# Patient Record
Sex: Male | Born: 1980 | Race: White | Hispanic: No | Marital: Single | Smoking: Current every day smoker
Health system: Southern US, Community
[De-identification: ages and names within clinical notes are randomized; demographics above are authoritative.]

## PROBLEM LIST (undated history)

## (undated) DIAGNOSIS — F419 Anxiety disorder, unspecified: Secondary | ICD-10-CM

## (undated) DIAGNOSIS — S4990XA Unspecified injury of shoulder and upper arm, unspecified arm, initial encounter: Secondary | ICD-10-CM

## (undated) DIAGNOSIS — F319 Bipolar disorder, unspecified: Secondary | ICD-10-CM

## (undated) DIAGNOSIS — I10 Essential (primary) hypertension: Secondary | ICD-10-CM

## (undated) HISTORY — PX: CHOLECYSTECTOMY: SHX55

---

## 2012-07-01 ENCOUNTER — Emergency Department (HOSPITAL_COMMUNITY)
Admission: EM | Admit: 2012-07-01 | Discharge: 2012-07-01 | Disposition: A | Discharge status: Home / Self Care | Payer: Medicaid Other | Attending: Emergency Medicine | Admitting: Emergency Medicine

## 2012-07-01 ENCOUNTER — Encounter (HOSPITAL_COMMUNITY): Payer: Self-pay | Admitting: *Deleted

## 2012-07-01 ENCOUNTER — Emergency Department (HOSPITAL_COMMUNITY): Payer: Medicaid Other

## 2012-07-01 DIAGNOSIS — S39012A Strain of muscle, fascia and tendon of lower back, initial encounter: Secondary | ICD-10-CM

## 2012-07-01 DIAGNOSIS — R52 Pain, unspecified: Secondary | ICD-10-CM

## 2012-07-01 DIAGNOSIS — M542 Cervicalgia: Secondary | ICD-10-CM | POA: Insufficient documentation

## 2012-07-01 DIAGNOSIS — Z9089 Acquired absence of other organs: Secondary | ICD-10-CM | POA: Insufficient documentation

## 2012-07-01 DIAGNOSIS — E119 Type 2 diabetes mellitus without complications: Secondary | ICD-10-CM | POA: Insufficient documentation

## 2012-07-01 DIAGNOSIS — I10 Essential (primary) hypertension: Secondary | ICD-10-CM | POA: Insufficient documentation

## 2012-07-01 DIAGNOSIS — S335XXA Sprain of ligaments of lumbar spine, initial encounter: Secondary | ICD-10-CM | POA: Insufficient documentation

## 2012-07-01 DIAGNOSIS — S161XXA Strain of muscle, fascia and tendon at neck level, initial encounter: Secondary | ICD-10-CM

## 2012-07-01 DIAGNOSIS — S139XXA Sprain of joints and ligaments of unspecified parts of neck, initial encounter: Secondary | ICD-10-CM | POA: Insufficient documentation

## 2012-07-01 DIAGNOSIS — W010XXA Fall on same level from slipping, tripping and stumbling without subsequent striking against object, initial encounter: Secondary | ICD-10-CM | POA: Insufficient documentation

## 2012-07-01 DIAGNOSIS — M25519 Pain in unspecified shoulder: Secondary | ICD-10-CM | POA: Insufficient documentation

## 2012-07-01 HISTORY — DX: Essential (primary) hypertension: I10

## 2012-07-01 MED ORDER — DIAZEPAM 5 MG PO TABS
ORAL_TABLET | ORAL | Status: AC
  Filled 2012-07-01: qty 1

## 2012-07-01 MED ORDER — MORPHINE SULFATE 4 MG/ML IJ SOLN
8.0000 mg | Freq: Once | INTRAMUSCULAR | Status: DC
  Filled 2012-07-01: qty 2

## 2012-07-01 MED ORDER — DEXAMETHASONE SODIUM PHOSPHATE 4 MG/ML IJ SOLN
8.0000 mg | Freq: Once | INTRAMUSCULAR | Status: DC
  Filled 2012-07-01: qty 2

## 2012-07-01 MED ORDER — ONDANSETRON HCL 4 MG PO TABS
4.0000 mg | ORAL_TABLET | Freq: Once | ORAL | Status: AC
  Administered 2012-07-01: 4 mg via ORAL
  Filled 2012-07-01: qty 1

## 2012-07-01 MED ORDER — METHOCARBAMOL 500 MG PO TABS: ORAL_TABLET | ORAL | Status: DC

## 2012-07-01 MED ORDER — HYDROCODONE-ACETAMINOPHEN 7.5-500 MG PO TABS: ORAL_TABLET | ORAL | Status: DC

## 2012-07-01 MED ORDER — DIAZEPAM 5 MG PO TABS
10.0000 mg | ORAL_TABLET | Freq: Once | ORAL | Status: AC
  Administered 2012-07-01: 10 mg via ORAL
  Filled 2012-07-01: qty 2

## 2012-07-01 NOTE — ED Notes (Signed)
Alert, NAD, pain entire back.  Ambulatory in to tx room.

## 2012-07-01 NOTE — ED Provider Notes (Signed)
History     CSN: [id number]  Arrival date & time 07/01/12  4098   First MD Initiated Contact with Patient 07/01/12 2103      Chief Complaint  Patient presents with  . Fall    (Consider location/radiation/quality/duration/timing/severity/associated sxs/prior treatment) HPI Comments: Patient states that on yesterday September 23 he was carrying a TV. He stepped on a slick board and fell backwards mostly on his lower back. The patient states that he felt a" jar all way up to his head and neck. He was able to get up but it took quite a while for him to get up from a fallen position. The patient states he's been having increasing pain from his back up to his neck all during the day today, and presents to the emergency department for additional evaluation.  Patient is a 31 y.o. male presenting with fall. The history is provided by the patient.  Fall Pertinent negatives include no abdominal pain and no hematuria.    Past Medical History  Diagnosis Date  . Diabetes mellitus   . Hypertension     Past Surgical History  Procedure Date  . Cholecystectomy     History reviewed. No pertinent family history.  History  Substance Use Topics  . Smoking status: Current Every Day Smoker  . Smokeless tobacco: Not on file  . Alcohol Use: No      Review of Systems  Constitutional: Negative for activity change.       All ROS Neg except as noted in HPI  HENT: Negative for nosebleeds and neck pain.   Eyes: Negative for photophobia and discharge.  Respiratory: Negative for cough, shortness of breath and wheezing.   Cardiovascular: Negative for chest pain and palpitations.  Gastrointestinal: Negative for abdominal pain and blood in stool.  Genitourinary: Negative for dysuria, frequency and hematuria.  Musculoskeletal: Positive for arthralgias. Negative for back pain.  Skin: Negative.   Neurological: Negative for dizziness, seizures and speech difficulty.  Psychiatric/Behavioral: Negative  for hallucinations and confusion.    Allergies  Tramadol; Aspirin; and Motrin  Home Medications   Current Outpatient Rx  Name Route Sig Dispense Refill  . ACETAMINOPHEN 500 MG PO TABS Oral Take 1,000 mg by mouth daily as needed. For pain    . DIAZEPAM 10 MG PO TABS Oral Take 10 mg by mouth 2 (two) times daily.    Marland Kitchen LISINOPRIL-HYDROCHLOROTHIAZIDE 10-12.5 MG PO TABS Oral Take 1 tablet by mouth daily.    Marland Kitchen METFORMIN HCL 500 MG PO TABS Oral Take 500 mg by mouth 2 (two) times daily.    . OXYCODONE-ACETAMINOPHEN 5-325 MG PO TABS Oral Take 1 tablet by mouth every 4 (four) hours as needed.    Marland Kitchen QUETIAPINE FUMARATE 50 MG PO TABS Oral Take 50 mg by mouth at bedtime.      BP 132/92  Pulse 92  Temp 98.6 F (37 C)  Resp 20  SpO2 99%  Physical Exam  Nursing note and vitals reviewed. Constitutional: He is oriented to person, place, and time. He appears well-developed and well-nourished.  Non-toxic appearance.  HENT:  Head: Normocephalic.  Right Ear: Tympanic membrane and external ear normal.  Left Ear: Tympanic membrane and external ear normal.  Eyes: EOM and lids are normal. Pupils are equal, round, and reactive to light.  Neck: Normal range of motion. Neck supple. Carotid bruit is not present.       There is soreness of the neck there is pain of the lower portion of the  cervical area extending into the right and left shoulders. There is no palpable step off.  Cardiovascular: Normal rate, regular rhythm, normal heart sounds, intact distal pulses and normal pulses.   Pulmonary/Chest: Breath sounds normal. No respiratory distress.  Abdominal: Soft. Bowel sounds are normal. There is no tenderness. There is no guarding.  Musculoskeletal: Normal range of motion.       There is pain in the lower neck extending into the right and left shoulders posteriorly. Right greater than left. There is pain with attempted range of motion of the right shoulder. There is no dislocation. This pain is not new as  the patient has been told he will need surgery on the shoulder. The patient has pain of the thoracic spine area to palpation. There is paraspinal tenderness and spasm to palpation. There is pain to palpation of the lumbar area to palpation. There is paraspinal tenderness and spasm to palpation as well. The pain of the thoracic and the lumbar area are aggravated by change of position or movement. There is no pain to movement of the pelvis. There is full range of motion of the lower extremities.  Lymphadenopathy:       Head (right side): No submandibular adenopathy present.       Head (left side): No submandibular adenopathy present.    He has no cervical adenopathy.  Neurological: He is alert and oriented to person, place, and time. He has normal strength. No cranial nerve deficit or sensory deficit.  Skin: Skin is warm and dry.  Psychiatric: His speech is normal. His mood appears anxious.    ED Course  Procedures (including critical care time)  Labs Reviewed - No data to display No results found. Pulse oximetry 99% on room air. Within normal limits by my interpretation.  No diagnosis found.    MDM  I have reviewed nursing notes, vital signs, and all appropriate lab and imaging results for this patient. The CT scan of the cervical spine is negative. The x-ray of the thoracic spine and lumbar spine also read as negative by the radiologist. The patient is ambulatory to the bathroom with minimal problem. No gross neurologic deficits found. It was discussed with the patient that he has multiple areas of muscle spasm present. The expectations of soreness was also discussed with the patient. Patient is to follow with his primary care physician if not improving.       Kathie Dike, [place] 07/01/12 2226

## 2012-07-01 NOTE — ED Notes (Signed)
Miguel Weber when carrying a tv,  Miguel Weber on to low back, Has pain in low back up to head.  No LOC  . Alert.

## 2012-07-02 NOTE — ED Provider Notes (Signed)
Medical screening examination/treatment/procedure(s) were performed by non-physician practitioner and as supervising physician I was immediately available for consultation/collaboration.  Donnetta Hutching, MD 07/02/12 1723

## 2012-09-01 ENCOUNTER — Encounter (HOSPITAL_COMMUNITY): Payer: Self-pay | Admitting: Emergency Medicine

## 2012-09-01 ENCOUNTER — Emergency Department (HOSPITAL_COMMUNITY): Payer: Medicaid Other

## 2012-09-01 ENCOUNTER — Emergency Department (HOSPITAL_COMMUNITY)
Admission: EM | Admit: 2012-09-01 | Discharge: 2012-09-01 | Disposition: A | Discharge status: Home / Self Care | Payer: Medicaid Other | Attending: Emergency Medicine | Admitting: Emergency Medicine

## 2012-09-01 DIAGNOSIS — E119 Type 2 diabetes mellitus without complications: Secondary | ICD-10-CM | POA: Insufficient documentation

## 2012-09-01 DIAGNOSIS — X58XXXA Exposure to other specified factors, initial encounter: Secondary | ICD-10-CM | POA: Insufficient documentation

## 2012-09-01 DIAGNOSIS — S39012A Strain of muscle, fascia and tendon of lower back, initial encounter: Secondary | ICD-10-CM

## 2012-09-01 DIAGNOSIS — S161XXA Strain of muscle, fascia and tendon at neck level, initial encounter: Secondary | ICD-10-CM

## 2012-09-01 DIAGNOSIS — Z79899 Other long term (current) drug therapy: Secondary | ICD-10-CM | POA: Insufficient documentation

## 2012-09-01 DIAGNOSIS — S139XXA Sprain of joints and ligaments of unspecified parts of neck, initial encounter: Secondary | ICD-10-CM | POA: Insufficient documentation

## 2012-09-01 DIAGNOSIS — Y929 Unspecified place or not applicable: Secondary | ICD-10-CM | POA: Insufficient documentation

## 2012-09-01 DIAGNOSIS — Y939 Activity, unspecified: Secondary | ICD-10-CM | POA: Insufficient documentation

## 2012-09-01 DIAGNOSIS — F17200 Nicotine dependence, unspecified, uncomplicated: Secondary | ICD-10-CM | POA: Insufficient documentation

## 2012-09-01 DIAGNOSIS — S335XXA Sprain of ligaments of lumbar spine, initial encounter: Secondary | ICD-10-CM | POA: Insufficient documentation

## 2012-09-01 DIAGNOSIS — I10 Essential (primary) hypertension: Secondary | ICD-10-CM | POA: Insufficient documentation

## 2012-09-01 MED ORDER — OXYCODONE-ACETAMINOPHEN 5-325 MG PO TABS: 1.0000 | ORAL_TABLET | ORAL | Status: AC | PRN

## 2012-09-01 MED ORDER — CYCLOBENZAPRINE HCL 10 MG PO TABS
10.0000 mg | ORAL_TABLET | Freq: Once | ORAL | Status: AC
  Administered 2012-09-01: 10 mg via ORAL
  Filled 2012-09-01: qty 1

## 2012-09-01 MED ORDER — OXYCODONE-ACETAMINOPHEN 5-325 MG PO TABS
2.0000 | ORAL_TABLET | Freq: Once | ORAL | Status: AC
  Administered 2012-09-01: 2 via ORAL
  Filled 2012-09-01: qty 2

## 2012-09-01 MED ORDER — CYCLOBENZAPRINE HCL 10 MG PO TABS: 10.0000 mg | ORAL_TABLET | Freq: Three times a day (TID) | ORAL | Status: DC | PRN

## 2012-09-01 NOTE — ED Notes (Signed)
Low back pain, onset 2-3 days ago.

## 2012-09-01 NOTE — ED Notes (Signed)
Pt c/o back pain from the bottom of his neck to the top of buttocks. Denies trauma/injury. Pt states it feels like his spine is twisting as he walks.

## 2012-09-02 NOTE — ED Provider Notes (Signed)
History     CSN: [id number]  Arrival date & time 09/01/12  1647   First MD Initiated Contact with Patient 09/01/12 1658      Chief Complaint  Patient presents with  . Back Pain    (Consider location/radiation/quality/duration/timing/severity/associated sxs/prior treatment) HPI Comments: Patient c/o pain to his neck and lower back for several days.  States the pain is worse with certain movements and feels like his "spine is twisting" when he stands or walks.  Pain radiates into his left leg at times.  Patient reports hx of intermittent back pain .  He denies recent injury, incontinence, lower extremity weakness or numbness, saddle anesthesia's, dysuria, headaches, fever, chills, vomiting, visual changes or recent illness.   Patient is a 31 y.o. male presenting with back pain and neck injury. The history is provided by the patient.  Back Pain  This is a recurrent problem. The current episode started more than 2 days ago. The problem occurs constantly. The problem has been gradually worsening. The pain is associated with no known injury. The pain is present in the lumbar spine. The quality of the pain is described as aching. The pain radiates to the left thigh. The pain is moderate. The symptoms are aggravated by bending, twisting and certain positions. Associated symptoms include leg pain. Pertinent negatives include no chest pain, no fever, no numbness, no headaches, no abdominal pain, no abdominal swelling, no bowel incontinence, no perianal numbness, no bladder incontinence, no dysuria, no pelvic pain, no paresthesias, no paresis and no weakness. He has tried analgesics for the symptoms. The treatment provided mild relief.  Neck Injury This is a new problem. The current episode started in the past 7 days. The problem occurs constantly. The problem has been unchanged. Associated symptoms include arthralgias and neck pain. Pertinent negatives include no abdominal pain, chest pain, congestion,  diaphoresis, fever, headaches, joint swelling, nausea, numbness, rash, sore throat, swollen glands, urinary symptoms, vertigo, visual change, vomiting or weakness. The symptoms are aggravated by walking, standing and bending. He has tried oral narcotics for the symptoms. The treatment provided mild relief.    Past Medical History  Diagnosis Date  . Diabetes mellitus   . Hypertension     Past Surgical History  Procedure Date  . Cholecystectomy     No family history on file.  History  Substance Use Topics  . Smoking status: Current Every Day Smoker  . Smokeless tobacco: Not on file  . Alcohol Use: No      Review of Systems  Constitutional: Negative for fever, diaphoresis, activity change and appetite change.  HENT: Positive for neck pain. Negative for congestion, sore throat, facial swelling, trouble swallowing and neck stiffness.   Eyes: Negative for photophobia and visual disturbance.  Respiratory: Negative for shortness of breath.   Cardiovascular: Negative for chest pain.  Gastrointestinal: Negative for nausea, vomiting, abdominal pain, constipation and bowel incontinence.  Genitourinary: Negative for bladder incontinence, dysuria, hematuria, flank pain, decreased urine volume, difficulty urinating and pelvic pain.       No perineal numbness or incontinence of urine or feces  Musculoskeletal: Positive for back pain and arthralgias. Negative for joint swelling.  Skin: Negative for rash.  Neurological: Negative for dizziness, vertigo, facial asymmetry, weakness, numbness, headaches and paresthesias.  Psychiatric/Behavioral: Negative for confusion.  All other systems reviewed and are negative.    Allergies  Tramadol; Aspirin; and Motrin  Home Medications   Current Outpatient Rx  Name  Route  Sig  Dispense  Refill  .  ACETAMINOPHEN 500 MG PO TABS   Oral   Take 1,000 mg by mouth daily as needed. For pain         . DIAZEPAM 10 MG PO TABS   Oral   Take 10 mg by  mouth 2 (two) times daily.         Marland Kitchen LISINOPRIL-HYDROCHLOROTHIAZIDE 10-12.5 MG PO TABS   Oral   Take 1 tablet by mouth daily.         Marland Kitchen METFORMIN HCL 500 MG PO TABS   Oral   Take 500 mg by mouth 2 (two) times daily.         . OXYCODONE-ACETAMINOPHEN 10-325 MG PO TABS   Oral   Take 2 tablets by mouth once. *Took two remaining tablets in two separate doses from previous prescription*         . QUETIAPINE FUMARATE 25 MG PO TABS   Oral   Take 25 mg by mouth at bedtime.         . CYCLOBENZAPRINE HCL 10 MG PO TABS   Oral   Take 1 tablet (10 mg total) by mouth 3 (three) times daily as needed for muscle spasms.   21 tablet   0   . OXYCODONE-ACETAMINOPHEN 5-325 MG PO TABS   Oral   Take 1 tablet by mouth every 4 (four) hours as needed for pain.   16 tablet   0     BP 157/83  Pulse 81  Temp 98.2 F (36.8 C)  Resp 22  Ht 6\' 5"  (1.956 m)  Wt 330 lb (149.687 kg)  BMI 39.13 kg/m2  SpO2 96%  Physical Exam  Nursing note and vitals reviewed. Constitutional: He is oriented to person, place, and time. He appears well-developed and well-nourished. No distress.  HENT:  Head: Normocephalic and atraumatic.  Mouth/Throat: Oropharynx is clear and moist.  Eyes: EOM are normal. Pupils are equal, round, and reactive to light.  Neck: Normal range of motion and phonation normal. Neck supple. Muscular tenderness present. No spinous process tenderness present. No rigidity. No edema, no erythema and normal range of motion present. No Brudzinski's sign and no Kernig's sign noted. No thyromegaly present.         ttp of the cervical paraspinal muscles.  Grip strength is strong and equal bilaterally.  Sensation intact,  CR < 2 sec  Cardiovascular: Normal rate, regular rhythm, normal heart sounds and intact distal pulses.   No murmur heard. Pulmonary/Chest: Effort normal and breath sounds normal. No respiratory distress. He exhibits no tenderness.  Musculoskeletal: He exhibits tenderness.  He exhibits no edema.       Cervical back: He exhibits tenderness. He exhibits normal range of motion, no bony tenderness, no swelling, no deformity, no spasm and normal pulse.       Lumbar back: He exhibits tenderness, pain and spasm. He exhibits normal range of motion, no bony tenderness, no swelling, no deformity, no laceration and normal pulse.       Back:       ttp of the lumbar paraspinal muscles and left SI joint.  DP pulses are equal and brisk bilaterally, sensation intact.    Lymphadenopathy:    He has no cervical adenopathy.  Neurological: He is alert and oriented to person, place, and time. No sensory deficit. He exhibits normal muscle tone. Coordination and gait normal.  Reflex Scores:      Tricep reflexes are 2+ on the right side and 2+ on the left side.  Bicep reflexes are 2+ on the right side and 2+ on the left side.      Patellar reflexes are 2+ on the right side and 2+ on the left side.      Achilles reflexes are 2+ on the right side and 2+ on the left side. Skin: Skin is warm and dry.    ED Course  Procedures (including critical care time)  Labs Reviewed - No data to display Dg Cervical Spine Complete  09/01/2012  *RADIOLOGY REPORT*  Clinical Data: Left neck pain.  No known injury.  CERVICAL SPINE - COMPLETE 4+ VIEW  Comparison: CT dated 07/01/2012.  Findings: Normal appearing bones and soft tissues.  IMPRESSION: Normal examination.   Original Report Authenticated By: Beckie Salts, M.D.    Dg Lumbar Spine Complete  09/01/2012  *RADIOLOGY REPORT*  Clinical Data: Low back pain.  No known injury.  LUMBAR SPINE - COMPLETE 4+ VIEW  Comparison: 07/01/2012.  Findings: Five non-rib bearing lumbar vertebrae.  No significant change in mild levoconvex scoliosis and cholecystectomy clips.  No fractures, pars defects or subluxations.  IMPRESSION: No acute abnormality.   Original Report Authenticated By: Beckie Salts, M.D.      1. Lumbar strain   2. Cervical strain        MDM    Patient has ttp of the cervical and lumbar paraspinal muscles.  No focal neuro deficits on exam. No meningeal signs.  Pt is well appearing.   Ambulates with a steady gait.   Moves all extremities w/o difficulty.  Likely musculoskeletal injury/spasms.  Doubt emergent neurological or infectious process.     Pt agrees to apply ice, f/u with his PMD.  Given referral info for PMD at pt's request.  Also given orthopedic referral   Prescribed: Percocet #[id number] flexeril   Marquon Alcala L. [place], [place] 09/02/12 [id number]

## 2012-09-04 NOTE — ED Provider Notes (Signed)
Medical screening examination/treatment/procedure(s) were performed by non-physician practitioner and as supervising physician I was immediately available for consultation/collaboration.   Hurman Horn, MD 09/04/12 1910

## 2012-09-07 ENCOUNTER — Encounter (HOSPITAL_COMMUNITY): Payer: Self-pay | Admitting: Emergency Medicine

## 2012-09-07 ENCOUNTER — Emergency Department (HOSPITAL_COMMUNITY)
Admission: EM | Admit: 2012-09-07 | Discharge: 2012-09-07 | Disposition: A | Discharge status: Home / Self Care | Payer: Medicaid Other | Attending: Emergency Medicine | Admitting: Emergency Medicine

## 2012-09-07 DIAGNOSIS — F17200 Nicotine dependence, unspecified, uncomplicated: Secondary | ICD-10-CM | POA: Insufficient documentation

## 2012-09-07 DIAGNOSIS — E119 Type 2 diabetes mellitus without complications: Secondary | ICD-10-CM | POA: Insufficient documentation

## 2012-09-07 DIAGNOSIS — Z9889 Other specified postprocedural states: Secondary | ICD-10-CM | POA: Insufficient documentation

## 2012-09-07 DIAGNOSIS — M545 Low back pain, unspecified: Secondary | ICD-10-CM | POA: Insufficient documentation

## 2012-09-07 DIAGNOSIS — M549 Dorsalgia, unspecified: Secondary | ICD-10-CM

## 2012-09-07 DIAGNOSIS — L738 Other specified follicular disorders: Secondary | ICD-10-CM | POA: Insufficient documentation

## 2012-09-07 DIAGNOSIS — L739 Follicular disorder, unspecified: Secondary | ICD-10-CM

## 2012-09-07 DIAGNOSIS — I10 Essential (primary) hypertension: Secondary | ICD-10-CM | POA: Insufficient documentation

## 2012-09-07 DIAGNOSIS — Z79899 Other long term (current) drug therapy: Secondary | ICD-10-CM | POA: Insufficient documentation

## 2012-09-07 MED ORDER — OXYCODONE-ACETAMINOPHEN 5-325 MG PO TABS: 1.0000 | ORAL_TABLET | ORAL | Status: AC | PRN

## 2012-09-07 MED ORDER — SULFAMETHOXAZOLE-TMP DS 800-160 MG PO TABS
1.0000 | ORAL_TABLET | Freq: Once | ORAL | Status: AC
  Administered 2012-09-07: 1 via ORAL
  Filled 2012-09-07: qty 1

## 2012-09-07 MED ORDER — OXYCODONE-ACETAMINOPHEN 5-325 MG PO TABS
2.0000 | ORAL_TABLET | Freq: Once | ORAL | Status: AC
  Administered 2012-09-07: 2 via ORAL
  Filled 2012-09-07: qty 2

## 2012-09-07 MED ORDER — SULFAMETHOXAZOLE-TRIMETHOPRIM 800-160 MG PO TABS: 1.0000 | ORAL_TABLET | Freq: Two times a day (BID) | ORAL | Status: DC

## 2012-09-07 NOTE — ED Notes (Signed)
Pt c/o middle back pain and abscess to the left breast.

## 2012-09-07 NOTE — ED Provider Notes (Signed)
History     CSN: [id number]  Arrival date & time 09/07/12  1303   First MD Initiated Contact with Patient 09/07/12 1335      Chief Complaint  Patient presents with  . Back Pain  . Abscess    (Consider location/radiation/quality/duration/timing/severity/associated sxs/prior treatment) HPI Comments: Patient complains of persistent low back pain for several days to weeks. He states that he was seen here recently and treated for the same. Pain improved but has now returned. States that he is out of his pain medication.patient denies known injury. States pain is worse with certain movements and improves with rest. He has not followed up with his primary care physician.  He denies any incontinence, weakness or numbness to lower extremities, saddle anesthesia's, dysuria, abdominal pain, or fever.  Patient also complains of a painful red" knot" to his chest that he noticed 3 days ago. He states that he thought it was " an infected hair" he complains of worsening pain and drainage to the area. 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  Patient is a 31 y.o. male presenting with back pain. The history is provided by the patient.  Back Pain  This is a chronic problem. The current episode started more than 1 week ago. The problem occurs constantly. The problem has not changed since onset.The pain is associated with no known injury. The pain is present in the lumbar spine and thoracic spine. The quality of the pain is described as aching. The pain does not radiate. The pain is moderate. The symptoms are aggravated by bending, twisting and certain positions. Pertinent negatives include no chest pain, no fever, no numbness, no abdominal pain, no abdominal swelling, no bowel incontinence, no perianal numbness, no bladder incontinence, no dysuria, no pelvic pain, no leg pain, no paresthesias, no paresis, no tingling and no weakness. He has tried analgesics and muscle relaxants for the symptoms. The treatment  provided mild relief.    Past Medical History  Diagnosis Date  . Diabetes mellitus   . Hypertension     Past Surgical History  Procedure Date  . Cholecystectomy     History reviewed. No pertinent family history.  History  Substance Use Topics  . Smoking status: Current Every Day Smoker  . Smokeless tobacco: Not on file  . Alcohol Use: No      Review of Systems  Constitutional: Negative for fever, chills, activity change and appetite change.  Respiratory: Negative for shortness of breath.   Cardiovascular: Negative for chest pain.  Gastrointestinal: Negative for nausea, vomiting, abdominal pain, constipation and bowel incontinence.  Genitourinary: Negative for bladder incontinence, dysuria, hematuria, flank pain, decreased urine volume, difficulty urinating and pelvic pain.       No perineal numbness or incontinence of urine or feces  Musculoskeletal: Positive for back pain. Negative for joint swelling and arthralgias.  Skin: Positive for color change. Negative for rash.       Abscess   Neurological: Negative for tingling, weakness, numbness and paresthesias.  Hematological: Negative for adenopathy.  All other systems reviewed and are negative.    Allergies  Tramadol; Aspirin; and Motrin  Home Medications   Current Outpatient Rx  Name  Route  Sig  Dispense  Refill  . ACETAMINOPHEN 500 MG PO TABS   Oral   Take 1,000 mg by mouth every 6 (six) hours as needed. For pain         . DIAZEPAM 10 MG PO TABS   Oral  Take 10 mg by mouth 2 (two) times daily.         Marland Kitchen LISINOPRIL-HYDROCHLOROTHIAZIDE 10-12.5 MG PO TABS   Oral   Take 1 tablet by mouth daily.         Marland Kitchen METFORMIN HCL 500 MG PO TABS   Oral   Take 500 mg by mouth 2 (two) times daily.         . QUETIAPINE FUMARATE 25 MG PO TABS   Oral   Take 25 mg by mouth at bedtime.         . CYCLOBENZAPRINE HCL 10 MG PO TABS   Oral   Take 1 tablet (10 mg total) by mouth 3 (three) times daily as needed  for muscle spasms.   21 tablet   0   . OXYCODONE-ACETAMINOPHEN 5-325 MG PO TABS   Oral   Take 1 tablet by mouth every 4 (four) hours as needed for pain.   16 tablet   0     BP 152/82  Pulse 84  Temp 98 F (36.7 C) (Oral)  Resp 20  Ht 6\' 4"  (1.93 m)  Wt 320 lb (145.151 kg)  BMI 38.95 kg/m2  SpO2 98%  Physical Exam  Nursing note and vitals reviewed. Constitutional: He is oriented to person, place, and time. He appears well-developed and well-nourished. No distress.  HENT:  Head: Normocephalic and atraumatic.  Neck: Normal range of motion. Neck supple.  Cardiovascular: Normal rate, regular rhythm, normal heart sounds and intact distal pulses.   No murmur heard. Pulmonary/Chest: Effort normal and breath sounds normal. He exhibits no bony tenderness, no crepitus, no edema, no swelling and no retraction.         2 cm slight erythematous papule to the chest wall.  No induration or fluctuance.    Musculoskeletal: He exhibits tenderness. He exhibits no edema.       Lumbar back: He exhibits tenderness and pain. He exhibits normal range of motion, no swelling, no deformity, no laceration and normal pulse.       Back:       ttp of the lumbar and lower thoracic paraspinal muscles.  DP pulses are equal and brisk bilaterally, distal sensation intact.  Back pain is reproduced with SLR bilaterally.    Neurological: He is alert and oriented to person, place, and time. No sensory deficit. He exhibits normal muscle tone. Coordination and gait normal.  Reflex Scores:      Patellar reflexes are 2+ on the right side and 2+ on the left side.      Achilles reflexes are 2+ on the right side and 2+ on the left side. Skin: Skin is warm and dry.    ED Course  Procedures (including critical care time)  Labs Reviewed - No data to display No results found.      MDM    Previous ED chart was reviewed.  Pt was also seen by me on previous visit.  Has appt with his PMD tomorrow  I&D of  abscess not indicated at this time.    Prescribed: Percocet #8 Septra DS      Elmira Olkowski L. [place], [place] 09/09/12 1747

## 2012-09-07 NOTE — ED Notes (Signed)
Pt c/o lower back pain x1 week and presents with abscess to left chest area which he first noticed 3 days ago. Pt states he was seen here previously for back pain and was told to come back if symptoms didn't improve.

## 2012-09-12 NOTE — ED Provider Notes (Signed)
Medical screening examination/treatment/procedure(s) were performed by non-physician practitioner and as supervising physician I was immediately available for consultation/collaboration.   Dione Booze, MD 09/12/12 [phone]

## 2012-09-29 ENCOUNTER — Emergency Department (HOSPITAL_COMMUNITY)
Admission: EM | Admit: 2012-09-29 | Discharge: 2012-09-29 | Disposition: A | Discharge status: Home / Self Care | Payer: Medicaid Other | Attending: Emergency Medicine | Admitting: Emergency Medicine

## 2012-09-29 ENCOUNTER — Encounter (HOSPITAL_COMMUNITY): Payer: Self-pay | Admitting: *Deleted

## 2012-09-29 DIAGNOSIS — Y939 Activity, unspecified: Secondary | ICD-10-CM | POA: Insufficient documentation

## 2012-09-29 DIAGNOSIS — S43499A Other sprain of unspecified shoulder joint, initial encounter: Secondary | ICD-10-CM | POA: Insufficient documentation

## 2012-09-29 DIAGNOSIS — Y929 Unspecified place or not applicable: Secondary | ICD-10-CM | POA: Insufficient documentation

## 2012-09-29 DIAGNOSIS — X58XXXA Exposure to other specified factors, initial encounter: Secondary | ICD-10-CM | POA: Insufficient documentation

## 2012-09-29 DIAGNOSIS — Z791 Long term (current) use of non-steroidal anti-inflammatories (NSAID): Secondary | ICD-10-CM | POA: Insufficient documentation

## 2012-09-29 DIAGNOSIS — F17200 Nicotine dependence, unspecified, uncomplicated: Secondary | ICD-10-CM | POA: Insufficient documentation

## 2012-09-29 DIAGNOSIS — E119 Type 2 diabetes mellitus without complications: Secondary | ICD-10-CM | POA: Insufficient documentation

## 2012-09-29 DIAGNOSIS — I10 Essential (primary) hypertension: Secondary | ICD-10-CM | POA: Insufficient documentation

## 2012-09-29 DIAGNOSIS — S46819A Strain of other muscles, fascia and tendons at shoulder and upper arm level, unspecified arm, initial encounter: Secondary | ICD-10-CM

## 2012-09-29 DIAGNOSIS — Z79899 Other long term (current) drug therapy: Secondary | ICD-10-CM | POA: Insufficient documentation

## 2012-09-29 MED ORDER — PREDNISONE 50 MG PO TABS
60.0000 mg | ORAL_TABLET | Freq: Once | ORAL | Status: AC
  Administered 2012-09-29: 60 mg via ORAL
  Filled 2012-09-29: qty 1

## 2012-09-29 MED ORDER — HYDROCODONE-ACETAMINOPHEN 5-325 MG PO TABS
2.0000 | ORAL_TABLET | Freq: Once | ORAL | Status: AC
  Administered 2012-09-29: 2 via ORAL
  Filled 2012-09-29: qty 2

## 2012-09-29 MED ORDER — DIAZEPAM 5 MG PO TABS
10.0000 mg | ORAL_TABLET | Freq: Once | ORAL | Status: AC
  Administered 2012-09-29: 10 mg via ORAL
  Filled 2012-09-29: qty 2

## 2012-09-29 MED ORDER — METHOCARBAMOL 500 MG PO TABS: ORAL_TABLET | ORAL | Status: DC

## 2012-09-29 MED ORDER — HYDROCODONE-ACETAMINOPHEN 5-325 MG PO TABS: ORAL_TABLET | ORAL | Status: DC

## 2012-09-29 NOTE — ED Notes (Signed)
Pt c/o neck/back pain that began Saturday morning. Pt states "I woke up and felt my neck pop".

## 2012-09-29 NOTE — ED Notes (Signed)
"  My neck popped yesterday and its hurt since then"

## 2012-09-29 NOTE — ED Provider Notes (Signed)
History     CSN: [id number]  Arrival date & time 09/29/12  1444   First MD Initiated Contact with Patient 09/29/12 1520      Chief Complaint  Patient presents with  . Neck Pain    (Consider location/radiation/quality/duration/timing/severity/associated sxs/prior treatment) Patient is a 31 y.o. male presenting with neck pain. The history is provided by the patient.  Neck Pain  This is a chronic problem. The current episode started yesterday. The problem occurs constantly. The problem has not changed since onset.The pain is associated with nothing. There has been no fever. The pain is present in the right side. The quality of the pain is described as aching and cramping. The pain is moderate. The symptoms are aggravated by coughing and sneezing (certain positions.). The pain is the same all the time. Pertinent negatives include no photophobia, no chest pain, no syncope, no bowel incontinence and no bladder incontinence. He has tried analgesics for the symptoms.    Past Medical History  Diagnosis Date  . Diabetes mellitus   . Hypertension     Past Surgical History  Procedure Date  . Cholecystectomy     History reviewed. No pertinent family history.  History  Substance Use Topics  . Smoking status: Current Every Day Smoker  . Smokeless tobacco: Not on file  . Alcohol Use: No      Review of Systems  Constitutional: Negative for activity change.       All ROS Neg except as noted in HPI  HENT: Positive for neck pain. Negative for nosebleeds.   Eyes: Negative for photophobia and discharge.  Respiratory: Negative for cough, shortness of breath and wheezing.   Cardiovascular: Negative for chest pain, palpitations and syncope.  Gastrointestinal: Negative for abdominal pain, blood in stool and bowel incontinence.  Genitourinary: Negative for bladder incontinence, dysuria, frequency and hematuria.  Musculoskeletal: Negative for back pain and arthralgias.  Skin: Negative.    Neurological: Negative for dizziness, seizures and speech difficulty.  Psychiatric/Behavioral: Negative for hallucinations and confusion.    Allergies  Tramadol; Aspirin; and Motrin  Home Medications   Current Outpatient Rx  Name  Route  Sig  Dispense  Refill  . ACETAMINOPHEN 500 MG PO TABS   Oral   Take 1,000 mg by mouth every 6 (six) hours as needed. For pain         . DIAZEPAM 10 MG PO TABS   Oral   Take 10 mg by mouth 2 (two) times daily.         Marland Kitchen LISINOPRIL-HYDROCHLOROTHIAZIDE 10-12.5 MG PO TABS   Oral   Take 1 tablet by mouth daily.         Marland Kitchen METFORMIN HCL 500 MG PO TABS   Oral   Take 500 mg by mouth 2 (two) times daily.           BP 144/79  Pulse 87  Temp 98.1 F (36.7 C) (Oral)  Resp 18  Ht 6\' 3"  (1.905 m)  Wt 320 lb (145.151 kg)  BMI 40.00 kg/m2  SpO2 98%  Physical Exam  Nursing note and vitals reviewed. Constitutional: He is oriented to person, place, and time. He appears well-developed and well-nourished.  Non-toxic appearance.  HENT:  Head: Normocephalic.  Right Ear: Tympanic membrane and external ear normal.  Left Ear: Tympanic membrane and external ear normal.  Eyes: EOM and lids are normal. Pupils are equal, round, and reactive to light.  Neck: Normal range of motion. Neck supple. Carotid bruit is not  present.  Cardiovascular: Normal rate, regular rhythm, normal heart sounds, intact distal pulses and normal pulses.   Pulmonary/Chest: Breath sounds normal. No respiratory distress.  Abdominal: Soft. Bowel sounds are normal. There is no tenderness. There is no guarding.  Musculoskeletal: Normal range of motion.       Right trapezius pain with palpation and attempted ROM.Marland Kitchen  Lymphadenopathy:       Head (right side): No submandibular adenopathy present.       Head (left side): No submandibular adenopathy present.    He has no cervical adenopathy.  Neurological: He is alert and oriented to person, place, and time. He has normal strength. No  cranial nerve deficit or sensory deficit.       Grip symmetrical. No gross deficit.  Skin: Skin is warm and dry.  Psychiatric: He has a normal mood and affect. His speech is normal.    ED Course  Procedures (including critical care time)  Labs Reviewed - No data to display No results found.   No diagnosis found.    MDM  I have reviewed nursing notes, vital signs, and all appropriate lab and imaging results for this patient. Exam suggest muscle strain in the trapezius area. No neuro deficits. Pt treated with Norco and robaxin. Pt to see orthopedic specialist if not improving.       Kathie Dike, [place] 09/30/12 [phone]

## 2012-09-30 NOTE — ED Provider Notes (Signed)
Medical screening examination/treatment/procedure(s) were performed by non-physician practitioner and as supervising physician I was immediately available for consultation/collaboration.    Reylene Stauder R Haydn Cush, MD 09/30/12 2239 

## 2012-10-04 ENCOUNTER — Encounter (HOSPITAL_COMMUNITY): Payer: Self-pay

## 2012-10-04 ENCOUNTER — Emergency Department (HOSPITAL_COMMUNITY)
Admission: EM | Admit: 2012-10-04 | Discharge: 2012-10-04 | Disposition: A | Discharge status: Home / Self Care | Payer: Medicaid Other | Attending: Emergency Medicine | Admitting: Emergency Medicine

## 2012-10-04 DIAGNOSIS — M549 Dorsalgia, unspecified: Secondary | ICD-10-CM | POA: Insufficient documentation

## 2012-10-04 DIAGNOSIS — H53149 Visual discomfort, unspecified: Secondary | ICD-10-CM | POA: Insufficient documentation

## 2012-10-04 DIAGNOSIS — F17200 Nicotine dependence, unspecified, uncomplicated: Secondary | ICD-10-CM | POA: Insufficient documentation

## 2012-10-04 DIAGNOSIS — H00012 Hordeolum externum right lower eyelid: Secondary | ICD-10-CM

## 2012-10-04 DIAGNOSIS — I10 Essential (primary) hypertension: Secondary | ICD-10-CM | POA: Insufficient documentation

## 2012-10-04 DIAGNOSIS — E119 Type 2 diabetes mellitus without complications: Secondary | ICD-10-CM | POA: Insufficient documentation

## 2012-10-04 DIAGNOSIS — H00019 Hordeolum externum unspecified eye, unspecified eyelid: Secondary | ICD-10-CM | POA: Insufficient documentation

## 2012-10-04 MED ORDER — HYDROCODONE-ACETAMINOPHEN 5-325 MG PO TABS: ORAL_TABLET | ORAL | Status: DC

## 2012-10-04 MED ORDER — CYCLOBENZAPRINE HCL 10 MG PO TABS: 10.0000 mg | ORAL_TABLET | Freq: Three times a day (TID) | ORAL | Status: DC | PRN

## 2012-10-04 MED ORDER — TOBRAMYCIN 0.3 % OP SOLN
2.0000 [drp] | Freq: Once | OPHTHALMIC | Status: AC
  Administered 2012-10-04: 2 [drp] via OPHTHALMIC
  Filled 2012-10-04: qty 5

## 2012-10-04 NOTE — ED Notes (Signed)
Pt c/o bilateral shoulder pain since Wednesday.

## 2012-10-04 NOTE — ED Notes (Signed)
Pt reports that daughter jumped on his back 3-4 days ago and cont. To have back and neck pain, also having drainage from right eye since Wednesday.

## 2012-10-04 NOTE — ED Provider Notes (Signed)
History     CSN: [id number]  Arrival date & time 10/04/12  1546   First MD Initiated Contact with Patient 10/04/12 1733      Chief Complaint  Patient presents with  . Back Pain  . Eye Drainage    (Consider location/radiation/quality/duration/timing/severity/associated sxs/prior treatment) HPI Comments: Patient with hx of chronic low back pain c/o increased pain for several days after his young daughter jumped onto his back.  Describes a aching , sharp pain that radiates from his right neck down to his lower back.  States pain is similar to previous pain.  Patient was seen here several days ago and treated for neck pain.  He denies dysuria, incontinence, saddle anesthesia's , abdominal pain or testicular pain.  He also c/o pain, redness, drainage and swelling of the right lower eyelid for several days.  He denies contact use, fever, neck pain or vomiting  Patient is a 31 y.o. male presenting with back pain. The history is provided by the patient.  Back Pain  This is a recurrent problem. The current episode started more than 2 days ago. The problem occurs constantly. The problem has not changed since onset.The pain is associated with twisting (direct blow). The pain is present in the lumbar spine. The quality of the pain is described as aching. The pain does not radiate. The pain is moderate. The symptoms are aggravated by bending, twisting and certain positions. The pain is the same all the time. Pertinent negatives include no fever, no numbness, no headaches, no abdominal pain, no dysuria and no weakness. He has tried analgesics, ice and heat for the symptoms. The treatment provided no relief.    Past Medical History  Diagnosis Date  . Diabetes mellitus   . Hypertension     Past Surgical History  Procedure Date  . Cholecystectomy     No family history on file.  History  Substance Use Topics  . Smoking status: Current Every Day Smoker    Types: Cigarettes  . Smokeless tobacco:  Not on file  . Alcohol Use: No      Review of Systems  Constitutional: Negative for fever.  HENT: Negative for sore throat, facial swelling, trouble swallowing, neck pain and neck stiffness.   Eyes: Positive for photophobia, pain, discharge, redness and visual disturbance.  Respiratory: Negative for shortness of breath.   Gastrointestinal: Negative for vomiting, abdominal pain and constipation.  Genitourinary: Negative for dysuria, hematuria, flank pain, decreased urine volume and difficulty urinating.       No perineal numbness or incontinence of urine or feces  Musculoskeletal: Positive for back pain. Negative for joint swelling.  Skin: Negative for rash.  Neurological: Negative for dizziness, facial asymmetry, weakness, numbness and headaches.  All other systems reviewed and are negative.    Allergies  Tramadol; Aspirin; and Motrin  Home Medications   Current Outpatient Rx  Name  Route  Sig  Dispense  Refill  . ACETAMINOPHEN 500 MG PO TABS   Oral   Take 1,000 mg by mouth every 6 (six) hours as needed. For pain         . DIAZEPAM 10 MG PO TABS   Oral   Take 10 mg by mouth 2 (two) times daily.         Marland Kitchen LISINOPRIL-HYDROCHLOROTHIAZIDE 10-12.5 MG PO TABS   Oral   Take 1 tablet by mouth daily.         Marland Kitchen METFORMIN HCL 500 MG PO TABS   Oral  Take 500 mg by mouth 2 (two) times daily.           BP 138/91  Pulse 80  Temp 97.2 F (36.2 C) (Oral)  Resp 20  Ht 6\' 4"  (1.93 m)  Wt 330 lb (149.687 kg)  BMI 40.17 kg/m2  SpO2 100%  Physical Exam  Nursing note and vitals reviewed. Constitutional: He is oriented to person, place, and time. He appears well-developed and well-nourished. No distress.  HENT:  Head: Normocephalic and atraumatic.  Right Ear: Tympanic membrane and ear canal normal.  Left Ear: Tympanic membrane and ear canal normal.  Mouth/Throat: Uvula is midline, oropharynx is clear and moist and mucous membranes are normal.  Eyes: Conjunctivae  normal and EOM are normal. Pupils are equal, round, and reactive to light. Right eye exhibits exudate and hordeolum. Right eye exhibits no chemosis. No foreign body present in the right eye. Left eye exhibits no discharge, no exudate and no hordeolum. No foreign body present in the left eye. No scleral icterus.    Neck: Normal range of motion. Neck supple.  Cardiovascular: Normal rate, regular rhythm and intact distal pulses.   No murmur heard. Pulmonary/Chest: Effort normal and breath sounds normal.  Musculoskeletal: He exhibits tenderness. He exhibits no edema.       Lumbar back: He exhibits tenderness and pain. He exhibits normal range of motion, no bony tenderness, no swelling, no deformity, no laceration and normal pulse.       Back:       Diffuse ttp of the lumbar and thoracic paraspinal muscles.  Also ttp along the right scapular border. Pt moves all extremities w/o diffuculty  Lymphadenopathy:       Head (right side): No preauricular and no posterior auricular adenopathy present.    He has no cervical adenopathy.  Neurological: He is alert and oriented to person, place, and time. No cranial nerve deficit or sensory deficit. He exhibits normal muscle tone. Coordination and gait normal.  Reflex Scores:      Patellar reflexes are 2+ on the right side and 2+ on the left side.      Achilles reflexes are 2+ on the right side and 2+ on the left side. Skin: Skin is warm and dry.    ED Course  Procedures (including critical care time)  Labs Reviewed - No data to display No results found.      MDM    Previous ED chart reviewed.    Patient has diffuse ttp of the thoracic and lumbar paraspinal muscles.  No focal neuro deficits on exam.  Ambulates with a slow but steady gait.   Pt has multiple ED visits for back and neck pain.  Doubt emergent neuro or infectious process.    Will give referral for pain management.  Pt advised that he will need to f/u with his PMD or pain management     Prescribed: Dispensed tobramycin ophth drops Flexeril  norco #10  Charliene Inoue L. [place], [place] 10/06/12 2109

## 2012-10-06 NOTE — ED Provider Notes (Signed)
Medical screening examination/treatment/procedure(s) were performed by non-physician practitioner and as supervising physician I was immediately available for consultation/collaboration.  Leyton Brownlee, MD 10/06/12 2112 

## 2012-11-23 ENCOUNTER — Emergency Department (HOSPITAL_COMMUNITY): Payer: Medicaid Other

## 2012-11-23 ENCOUNTER — Emergency Department (HOSPITAL_COMMUNITY)
Admission: EM | Admit: 2012-11-23 | Discharge: 2012-11-23 | Disposition: A | Discharge status: Home / Self Care | Payer: Medicaid Other | Attending: Emergency Medicine | Admitting: Emergency Medicine

## 2012-11-23 ENCOUNTER — Encounter (HOSPITAL_COMMUNITY): Payer: Self-pay

## 2012-11-23 DIAGNOSIS — S20229A Contusion of unspecified back wall of thorax, initial encounter: Secondary | ICD-10-CM | POA: Insufficient documentation

## 2012-11-23 DIAGNOSIS — W010XXA Fall on same level from slipping, tripping and stumbling without subsequent striking against object, initial encounter: Secondary | ICD-10-CM | POA: Insufficient documentation

## 2012-11-23 DIAGNOSIS — S335XXA Sprain of ligaments of lumbar spine, initial encounter: Secondary | ICD-10-CM | POA: Insufficient documentation

## 2012-11-23 DIAGNOSIS — F17200 Nicotine dependence, unspecified, uncomplicated: Secondary | ICD-10-CM | POA: Insufficient documentation

## 2012-11-23 DIAGNOSIS — Y9289 Other specified places as the place of occurrence of the external cause: Secondary | ICD-10-CM | POA: Insufficient documentation

## 2012-11-23 DIAGNOSIS — Y9389 Activity, other specified: Secondary | ICD-10-CM | POA: Insufficient documentation

## 2012-11-23 DIAGNOSIS — E119 Type 2 diabetes mellitus without complications: Secondary | ICD-10-CM | POA: Insufficient documentation

## 2012-11-23 DIAGNOSIS — Z79899 Other long term (current) drug therapy: Secondary | ICD-10-CM | POA: Insufficient documentation

## 2012-11-23 DIAGNOSIS — S300XXA Contusion of lower back and pelvis, initial encounter: Secondary | ICD-10-CM

## 2012-11-23 DIAGNOSIS — I10 Essential (primary) hypertension: Secondary | ICD-10-CM | POA: Insufficient documentation

## 2012-11-23 MED ORDER — HYDROCODONE-ACETAMINOPHEN 5-325 MG PO TABS: 1.0000 | ORAL_TABLET | ORAL | Status: DC | PRN

## 2012-11-23 MED ORDER — DIAZEPAM 5 MG PO TABS: 5.0000 mg | ORAL_TABLET | Freq: Two times a day (BID) | ORAL | Status: DC

## 2012-11-23 MED ORDER — OXYCODONE-ACETAMINOPHEN 5-325 MG PO TABS
1.0000 | ORAL_TABLET | Freq: Once | ORAL | Status: AC
  Administered 2012-11-23: 1 via ORAL
  Filled 2012-11-23: qty 1

## 2012-11-23 NOTE — ED Provider Notes (Signed)
Medical screening examination/treatment/procedure(s) were performed by non-physician practitioner and as supervising physician I was immediately available for consultation/collaboration. Rod Majerus, MD, FACEP   Malcolm Hetz L Mccartney Chuba, MD 11/23/12 1623 

## 2012-11-23 NOTE — ED Provider Notes (Signed)
History     CSN: [id number]  Arrival date & time 11/23/12  1320   None     Chief Complaint  Patient presents with  . Fall    HPI Miguel Weber is a 32 y.o. male who presents to the ED with low back pain. The pain started last night after falling on the ice. He was chasing his daughter and fell on a stepping stone. Knocked the breath out. Thought would be ok, took tylenol PM and his last  valium and went to bed. This morning pain is worse. Difficulty getting out of bed.  Denies any other injuries. The history was provided by the patient.  Past Medical History  Diagnosis Date  . Diabetes mellitus   . Hypertension     Past Surgical History  Procedure Laterality Date  . Cholecystectomy      No family history on file.  History  Substance Use Topics  . Smoking status: Current Every Day Smoker    Types: Cigarettes  . Smokeless tobacco: Not on file  . Alcohol Use: No      Review of Systems  Constitutional: Negative for fever and chills.  HENT: Negative for facial swelling, neck pain and neck stiffness.   Eyes: Negative for pain and visual disturbance.  Respiratory: Negative for cough and wheezing.   Cardiovascular: Negative for chest pain and palpitations.  Gastrointestinal: Negative for nausea, vomiting and abdominal pain.  Genitourinary: Negative for dysuria, frequency and difficulty urinating.  Musculoskeletal: Positive for back pain. Gait problem: due to pain.  Skin: Negative for wound.  Neurological: Negative for seizures, syncope and headaches.    Allergies  Tramadol; Aspirin; and Motrin  Home Medications   Current Outpatient Rx  Name  Route  Sig  Dispense  Refill  . acetaminophen (TYLENOL) 500 MG tablet   Oral   Take 1,000 mg by mouth every 6 (six) hours as needed. For pain         . cyclobenzaprine (FLEXERIL) 10 MG tablet   Oral   Take 1 tablet (10 mg total) by mouth 3 (three) times daily as needed for muscle spasms.   21 tablet   0   . diazepam  (VALIUM) 10 MG tablet   Oral   Take 10 mg by mouth 2 (two) times daily.         Marland Kitchen HYDROcodone-acetaminophen (NORCO/VICODIN) 5-325 MG per tablet      Take one-two tabs po q 4-6 hrs prn pain   10 tablet   0   . lisinopril-hydrochlorothiazide (PRINZIDE,ZESTORETIC) 10-12.5 MG per tablet   Oral   Take 1 tablet by mouth daily.         . metFORMIN (GLUCOPHAGE) 500 MG tablet   Oral   Take 500 mg by mouth 2 (two) times daily.           BP 133/88  Pulse 77  Temp(Src) 97.8 F (36.6 C) (Oral)  Resp 20  Ht 6\' 5"  (1.956 m)  Wt 330 lb (149.687 kg)  BMI 39.12 kg/m2  SpO2 98%  Physical Exam  Nursing note and vitals reviewed. Constitutional: He is oriented to person, place, and time. He appears well-developed and well-nourished.  HENT:  Head: Normocephalic and atraumatic.  Eyes: EOM are normal. Pupils are equal, round, and reactive to light.  Neck: Neck supple.  Cardiovascular: Normal rate and regular rhythm.   Pulmonary/Chest: Effort normal and breath sounds normal.  Abdominal: Soft. There is no tenderness.  Musculoskeletal:  Tender lower left lumbar  area, area of ecchymosis noted. Increased pain with range of motion. Pedal pulses strong and equal bilateral. Adequate circulation. Good touch sensation.   Neurological: He is alert and oriented to person, place, and time. He has normal strength and normal reflexes. No cranial nerve deficit or sensory deficit. Gait normal.  Skin: Skin is warm and dry.  Psychiatric: He has a normal mood and affect. His behavior is normal. Judgment and thought content normal.    ED Course  Procedures  Labs Reviewed - No data to display Dg Lumbar Spine Complete  11/23/2012  *RADIOLOGY REPORT*  Clinical Data: Fall  LUMBAR SPINE - COMPLETE 4+ VIEW  Comparison: 09/01/2012  Findings: Anatomic alignment.  Mild narrowing of the L4-5 and L5-S1 discs.  No vertebral compression deformity.  No definite acute fracture.  No pars defect.  IMPRESSION: No acute  bony pathology.   Original Report Authenticated By: Jolaine Click, M.D.    Assessment: 32 y.o. male with lumbar pain due to fall   Contusion and strain of lumbar area  Plan:  Pain management, muscle relaxant   Follow up with ortho Discussed with the patient and all questioned fully answered.   Medication List    TAKE these medications       diazepam 5 MG tablet  Commonly known as:  VALIUM  Take 1 tablet (5 mg total) by mouth 2 (two) times daily.     HYDROcodone-acetaminophen 5-325 MG per tablet  Commonly known as:  NORCO/VICODIN  Take 1 tablet by mouth every 4 (four) hours as needed for pain.      ASK your doctor about these medications       acetaminophen 500 MG tablet  Commonly known as:  TYLENOL  Take 1,000 mg by mouth every 6 (six) hours as needed. For pain     cyclobenzaprine 10 MG tablet  Commonly known as:  FLEXERIL  Take 1 tablet (10 mg total) by mouth 3 (three) times daily as needed for muscle spasms.     diazepam 10 MG tablet  Commonly known as:  VALIUM  Take 10 mg by mouth 2 (two) times daily.     HYDROcodone-acetaminophen 5-325 MG per tablet  Commonly known as:  NORCO/VICODIN  Take one-two tabs po q 4-6 hrs prn pain     lisinopril-hydrochlorothiazide 10-12.5 MG per tablet  Commonly known as:  PRINZIDE,ZESTORETIC  Take 1 tablet by mouth daily.     metFORMIN 500 MG tablet  Commonly known as:  GLUCOPHAGE  Take 500 mg by mouth 2 (two) times daily.         Janne Napoleon, [place] 11/23/12 1515

## 2012-11-23 NOTE — ED Notes (Signed)
Pt slipped on ice last night and landed on bottom but also landed on a steeping stone, one side of stone went into ground on side pt landed on but other side came up and hit back, pt is able to ambulate, denies hitting his head, pain to lower back

## 2012-11-23 NOTE — ED Notes (Signed)
Complain of low back pain from a fall last night

## 2012-12-24 ENCOUNTER — Emergency Department (HOSPITAL_COMMUNITY)
Admission: EM | Admit: 2012-12-24 | Discharge: 2012-12-24 | Disposition: A | Discharge status: Home / Self Care | Payer: Medicaid Other | Attending: Emergency Medicine | Admitting: Emergency Medicine

## 2012-12-24 ENCOUNTER — Encounter (HOSPITAL_COMMUNITY): Payer: Self-pay | Admitting: *Deleted

## 2012-12-24 DIAGNOSIS — E119 Type 2 diabetes mellitus without complications: Secondary | ICD-10-CM | POA: Insufficient documentation

## 2012-12-24 DIAGNOSIS — IMO0002 Reserved for concepts with insufficient information to code with codable children: Secondary | ICD-10-CM | POA: Insufficient documentation

## 2012-12-24 DIAGNOSIS — Z79899 Other long term (current) drug therapy: Secondary | ICD-10-CM | POA: Insufficient documentation

## 2012-12-24 DIAGNOSIS — Z8614 Personal history of Methicillin resistant Staphylococcus aureus infection: Secondary | ICD-10-CM | POA: Insufficient documentation

## 2012-12-24 DIAGNOSIS — F17200 Nicotine dependence, unspecified, uncomplicated: Secondary | ICD-10-CM | POA: Insufficient documentation

## 2012-12-24 DIAGNOSIS — I10 Essential (primary) hypertension: Secondary | ICD-10-CM | POA: Insufficient documentation

## 2012-12-24 LAB — GLUCOSE, CAPILLARY: Glucose-Capillary: 132 mg/dL — ABNORMAL HIGH (ref 70–99)

## 2012-12-24 MED ORDER — OXYCODONE-ACETAMINOPHEN 5-325 MG PO TABS: 1.0000 | ORAL_TABLET | ORAL | Status: DC | PRN

## 2012-12-24 MED ORDER — AMOXICILLIN 500 MG PO CAPS: 1000.0000 mg | ORAL_CAPSULE | Freq: Two times a day (BID) | ORAL | Status: DC

## 2012-12-24 MED ORDER — DOXYCYCLINE HYCLATE 100 MG PO CAPS: 100.0000 mg | ORAL_CAPSULE | Freq: Two times a day (BID) | ORAL | Status: DC

## 2012-12-24 MED ORDER — OXYCODONE-ACETAMINOPHEN 5-325 MG PO TABS
1.0000 | ORAL_TABLET | Freq: Once | ORAL | Status: AC
  Administered 2012-12-24: 1 via ORAL
  Filled 2012-12-24: qty 1

## 2012-12-24 MED ORDER — PENICILLIN V POTASSIUM 250 MG PO TABS
500.0000 mg | ORAL_TABLET | Freq: Once | ORAL | Status: AC
  Administered 2012-12-24: 500 mg via ORAL
  Filled 2012-12-24: qty 2

## 2012-12-24 MED ORDER — DOXYCYCLINE HYCLATE 100 MG PO TABS
100.0000 mg | ORAL_TABLET | Freq: Once | ORAL | Status: AC
  Administered 2012-12-24: 100 mg via ORAL
  Filled 2012-12-24: qty 1

## 2012-12-24 NOTE — ED Notes (Signed)
Abscess under left arm.  

## 2012-12-24 NOTE — ED Notes (Signed)
nad noted prior to dc. Dc instructions reviewed and scripts given to pt. Ambulated out without difficulty.

## 2012-12-24 NOTE — ED Provider Notes (Signed)
History     CSN: [id number]  Arrival date & time 12/24/12  1723   First MD Initiated Contact with Patient 12/24/12 1827      Chief Complaint  Patient presents with  . Abscess    (Consider location/radiation/quality/duration/timing/severity/associated sxs/prior treatment) Patient is a 32 y.o. male presenting with abscess. The history is provided by the patient.  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 Diabetes mellitus   . Hypertension     Past Surgical History  Procedure Laterality Date  . Cholecystectomy      No family history on file.  History  Substance Use Topics  . Smoking status: Current Every Day Smoker -- 1.00 packs/day    Types: Cigarettes  . Smokeless tobacco: Not on file  . Alcohol Use: No      Review of Systems  Constitutional: Negative for fever and activity change.       All ROS Neg except as noted in HPI  HENT: Negative for nosebleeds and neck pain.   Eyes: Negative for photophobia and discharge.  Respiratory: Negative for cough, shortness of breath and wheezing.   Cardiovascular: Negative for chest pain and palpitations.  Gastrointestinal: Negative for abdominal pain and blood in stool.  Genitourinary: Negative for dysuria, frequency and hematuria.  Musculoskeletal: Negative for back pain and arthralgias.  Skin: Negative.        abscess   Neurological: Negative for dizziness, seizures and speech difficulty.  Psychiatric/Behavioral: Negative for hallucinations and confusion.    Allergies  Tramadol; Aspirin; and Motrin  Home Medications   Current Outpatient Rx  Name  Route  Sig  Dispense  Refill  . acetaminophen (TYLENOL) 500 MG tablet   Oral   Take 1,000 mg by mouth every 6 (six) hours as needed. For pain         . amoxicillin (AMOXIL) 500 MG capsule   Oral   Take 2 capsules (1,000 mg total) by mouth 2 (two) times daily.   28 capsule   0   . cyclobenzaprine (FLEXERIL) 10 MG tablet   Oral   Take 1 tablet (10 mg total) by mouth 3 (three) times daily as needed for muscle spasms.   21 tablet   0   . diazepam (VALIUM) 10 MG tablet   Oral   Take 10 mg by mouth 2 (two) times daily.         . diazepam (VALIUM) 5 MG tablet   Oral   Take 1 tablet (5 mg total) by mouth 2 (two) times daily.   10 tablet   0   . doxycycline (VIBRAMYCIN) 100 MG capsule   Oral   Take 1 capsule (100 mg total) by mouth 2 (two) times daily.   20 capsule   0   . HYDROcodone-acetaminophen (NORCO/VICODIN) 5-325 MG per tablet      Take one-two tabs  po q 4-6 hrs prn pain   10 tablet   0   . HYDROcodone-acetaminophen (NORCO/VICODIN) 5-325 MG per tablet   Oral   Take 1 tablet by mouth every 4 (four) hours as needed for pain.   10 tablet   0   . lisinopril-hydrochlorothiazide (PRINZIDE,ZESTORETIC) 10-12.5 MG per tablet   Oral   Take 1 tablet by mouth daily.         . metFORMIN (GLUCOPHAGE) 500 MG tablet   Oral   Take 500 mg by mouth 2 (two) times daily.         Marland Kitchen oxyCODONE-acetaminophen (PERCOCET/ROXICET) 5-325 MG per tablet   Oral   Take 1 tablet by mouth every 4 (four) hours as needed for pain.   24 tablet   0     BP 151/97  Pulse 68  Temp(Src) 97.8 F (36.6 C) (Oral)  Resp 18  Ht 6' 4.5" (1.943 m)  Wt 326 lb (147.873 kg)  BMI 39.17 kg/m2  SpO2 99%  Physical Exam  Nursing note and vitals  reviewed. Constitutional: He is oriented to person, place, and time. He appears well-developed and well-nourished.  Non-toxic appearance.  HENT:  Head: Normocephalic.  Right Ear: Tympanic membrane and external ear normal.  Left Ear: Tympanic membrane and external ear normal.  Eyes: EOM and lids are normal. Pupils are equal, round, and reactive to light.  Neck: Normal range of motion. Neck supple. Carotid bruit is not present.  Cardiovascular: Normal rate, regular rhythm, normal heart sounds, intact distal pulses and normal pulses.   Pulmonary/Chest: Breath sounds normal. No respiratory distress.  Abdominal: Soft. Bowel sounds are normal. There is no tenderness. There is no guarding.  Musculoskeletal: Normal range of motion.  Small abscess of the left axilla. Tender to palpation. No red streaking noted. No drainage present. The area is warm but not hot.  Lymphadenopathy:       Head (right side): No submandibular adenopathy present.       Head (left side): No submandibular adenopathy present.    He has no cervical adenopathy.  Neurological: He is alert and oriented to person, place, and time. He has normal strength. No cranial nerve deficit or sensory deficit.  Skin: Skin is warm and dry.  Psychiatric: He has a normal mood and affect. His speech is normal.    ED Course  Procedures (including critical care time)  Labs Reviewed  GLUCOSE, CAPILLARY - Abnormal; Notable for the following:    Glucose-Capillary 132 (*)    All other components within normal limits   No results found.   1. Abscess of axilla, left       MDM  I have reviewed nursing notes, vital signs, and all appropriate lab and imaging results for this patient. 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 He has had the raised tender area under his left axilla for the past 3-4 days. He denies any high fevers. The area is not draining. He has not been treated for  the problem prior to this emergency department visit.  The abscess is a small and not a candidate for incision and drainage at this time. The patient is asked to do warm tub soaks daily, use Amoxil and doxycycline daily. Patient is given a prescription for Percocet one every 4 hours as needed for pain, #24. Patient see his primary physician or return to the emergency department if any changes, problems, or concerns. Patient is also  advised to watch his glucose closely.       Kathie Dike, PA-C 12/24/12 1857

## 2012-12-25 NOTE — ED Provider Notes (Signed)
Medical screening examination/treatment/procedure(s) were performed by non-physician practitioner and as supervising physician I was immediately available for consultation/collaboration. Sigurd Pugh, MD, FACEP   Ernie Kasler L Peityn Payton, MD 12/25/12 0105 

## 2012-12-30 ENCOUNTER — Encounter (HOSPITAL_COMMUNITY): Payer: Self-pay | Admitting: *Deleted

## 2012-12-30 ENCOUNTER — Emergency Department (HOSPITAL_COMMUNITY)
Admission: EM | Admit: 2012-12-30 | Discharge: 2012-12-30 | Disposition: A | Discharge status: Home / Self Care | Payer: Medicaid Other | Attending: Emergency Medicine | Admitting: Emergency Medicine

## 2012-12-30 ENCOUNTER — Emergency Department (HOSPITAL_COMMUNITY): Payer: Medicaid Other

## 2012-12-30 DIAGNOSIS — S239XXA Sprain of unspecified parts of thorax, initial encounter: Secondary | ICD-10-CM | POA: Insufficient documentation

## 2012-12-30 DIAGNOSIS — R296 Repeated falls: Secondary | ICD-10-CM | POA: Insufficient documentation

## 2012-12-30 DIAGNOSIS — E119 Type 2 diabetes mellitus without complications: Secondary | ICD-10-CM | POA: Insufficient documentation

## 2012-12-30 DIAGNOSIS — F17200 Nicotine dependence, unspecified, uncomplicated: Secondary | ICD-10-CM | POA: Insufficient documentation

## 2012-12-30 DIAGNOSIS — Y9389 Activity, other specified: Secondary | ICD-10-CM | POA: Insufficient documentation

## 2012-12-30 DIAGNOSIS — S139XXA Sprain of joints and ligaments of unspecified parts of neck, initial encounter: Secondary | ICD-10-CM | POA: Insufficient documentation

## 2012-12-30 DIAGNOSIS — S161XXA Strain of muscle, fascia and tendon at neck level, initial encounter: Secondary | ICD-10-CM

## 2012-12-30 DIAGNOSIS — S29019A Strain of muscle and tendon of unspecified wall of thorax, initial encounter: Secondary | ICD-10-CM

## 2012-12-30 DIAGNOSIS — I10 Essential (primary) hypertension: Secondary | ICD-10-CM | POA: Insufficient documentation

## 2012-12-30 DIAGNOSIS — Y92009 Unspecified place in unspecified non-institutional (private) residence as the place of occurrence of the external cause: Secondary | ICD-10-CM | POA: Insufficient documentation

## 2012-12-30 DIAGNOSIS — L02419 Cutaneous abscess of limb, unspecified: Secondary | ICD-10-CM

## 2012-12-30 DIAGNOSIS — Z79899 Other long term (current) drug therapy: Secondary | ICD-10-CM | POA: Insufficient documentation

## 2012-12-30 DIAGNOSIS — IMO0002 Reserved for concepts with insufficient information to code with codable children: Secondary | ICD-10-CM | POA: Insufficient documentation

## 2012-12-30 MED ORDER — OXYCODONE-ACETAMINOPHEN 5-325 MG PO TABS
ORAL_TABLET | ORAL | Status: AC
  Filled 2012-12-30: qty 1

## 2012-12-30 MED ORDER — HYDROCODONE-ACETAMINOPHEN 5-325 MG PO TABS
1.0000 | ORAL_TABLET | Freq: Once | ORAL | Status: AC
  Administered 2012-12-30: 1 via ORAL
  Filled 2012-12-30: qty 1

## 2012-12-30 MED ORDER — OXYCODONE-ACETAMINOPHEN 5-325 MG PO TABS
2.0000 | ORAL_TABLET | Freq: Once | ORAL | Status: AC
  Administered 2012-12-30: 2 via ORAL
  Filled 2012-12-30: qty 1

## 2012-12-30 MED ORDER — CYCLOBENZAPRINE HCL 5 MG PO TABS: 5.0000 mg | ORAL_TABLET | Freq: Three times a day (TID) | ORAL | Status: DC | PRN

## 2012-12-30 MED ORDER — OXYCODONE-ACETAMINOPHEN 5-325 MG PO TABS: 1.0000 | ORAL_TABLET | ORAL | Status: DC | PRN

## 2012-12-30 NOTE — ED Notes (Signed)
Fell in yard yesterday, Now pain in back and neck.    No LOC.  Abscess lt axilla

## 2012-12-30 NOTE — ED Notes (Signed)
nad noted prior to dc. Dc instructions reviewed and 2 scripts given to pt. Pt ambulated out without difficulty.

## 2013-01-02 NOTE — ED Provider Notes (Signed)
History     CSN: [id number]  Arrival date & time 12/30/12  1723   First MD Initiated Contact with Patient 12/30/12 1812      Chief Complaint  Patient presents with  . Neck Pain    (Consider location/radiation/quality/duration/timing/severity/associated sxs/prior treatment) HPI Comments: Miguel Weber is a 32 y.o. Male who fell while working in his yard yesterday, landing on is buttocks.  He reports pain in his neck and his upper back which is constant, sharp and also cramping, worse with movement, certain positions and palpation.  He denies weakness or numbness in his extremities, and no other injury.  He has taken tylenol without relief of pain.  He is also currently taking amoxil and doxycycline for an abscess in his left axilla which is improved, smaller but still tender.  He would like this checked.  He denies fevers, chills. Pain is worse with pressure and does not radiate.    The history is provided by the patient.    Past Medical History  Diagnosis Date  . Diabetes mellitus   . Hypertension     Past Surgical History  Procedure Laterality Date  . Cholecystectomy      No family history on file.  History  Substance Use Topics  . Smoking status: Current Every Day Smoker -- 1.00 packs/day    Types: Cigarettes  . Smokeless tobacco: Not on file  . Alcohol Use: No      Review of Systems  Constitutional: Negative for fever.  HENT: Positive for neck pain. Negative for neck stiffness.   Respiratory: Negative for shortness of breath.   Cardiovascular: Negative for chest pain and leg swelling.  Gastrointestinal: Negative for abdominal pain, constipation and abdominal distention.  Genitourinary: Negative for dysuria, urgency, frequency, flank pain and difficulty urinating.  Musculoskeletal: Positive for back pain. Negative for joint swelling and gait problem.  Skin: Negative for color change and rash.  Neurological: Negative for weakness and numbness.    Allergies   Tramadol; Aspirin; and Motrin  Home Medications   Current Outpatient Rx  Name  Route  Sig  Dispense  Refill  . acetaminophen (TYLENOL) 500 MG tablet   Oral   Take 1,000 mg by mouth every 6 (six) hours as needed. For pain         . amoxicillin (AMOXIL) 500 MG capsule   Oral   Take 2 capsules (1,000 mg total) by mouth 2 (two) times daily.   28 capsule   0   . diazepam (VALIUM) 10 MG tablet   Oral   Take 10 mg by mouth 2 (two) times daily.         Marland Kitchen doxycycline (VIBRAMYCIN) 100 MG capsule   Oral   Take 1 capsule (100 mg total) by mouth 2 (two) times daily.   20 capsule   0   . lisinopril-hydrochlorothiazide (PRINZIDE,ZESTORETIC) 10-12.5 MG per tablet   Oral   Take 1 tablet by mouth daily.         . metFORMIN (GLUCOPHAGE) 500 MG tablet   Oral   Take 500 mg by mouth 2 (two) times daily.         Marland Kitchen oxyCODONE-acetaminophen (PERCOCET/ROXICET) 5-325 MG per tablet   Oral   Take 2 tablets by mouth once as needed for pain.         . cyclobenzaprine (FLEXERIL) 5 MG tablet   Oral   Take 1 tablet (5 mg total) by mouth 3 (three) times daily as needed for muscle spasms.  15 tablet   0   . oxyCODONE-acetaminophen (PERCOCET/ROXICET) 5-325 MG per tablet   Oral   Take 1 tablet by mouth every 4 (four) hours as needed for pain.   20 tablet   0     BP 152/89  Pulse 69  Temp(Src) 98.1 F (36.7 C) (Oral)  Resp 20  Ht 6\' 4"  (1.93 m)  Wt 320 lb (145.151 kg)  BMI 38.97 kg/m2  SpO2 99%  Physical Exam  Nursing note and vitals reviewed. Constitutional: He appears well-developed and well-nourished.  HENT:  Head: Normocephalic.  Eyes: Conjunctivae are normal.  Neck: Normal range of motion. Neck supple.  Cardiovascular: Normal rate and intact distal pulses.   Pedal pulses normal.  Pulmonary/Chest: Effort normal.  Abdominal: Soft. Bowel sounds are normal. He exhibits no distension and no mass.  Musculoskeletal: Normal range of motion. He exhibits no edema.        Cervical back: He exhibits tenderness. He exhibits normal range of motion, no bony tenderness, no edema and no spasm.       Thoracic back: He exhibits bony tenderness and spasm.       Lumbar back: He exhibits no tenderness, no swelling, no edema and no spasm.  Neurological: He is alert. He has normal strength. He displays no atrophy and no tremor. No sensory deficit. Gait normal.  Reflex Scores:      Patellar reflexes are 2+ on the right side and 2+ on the left side.      Achilles reflexes are 2+ on the right side and 2+ on the left side. No strength deficit noted in hip and knee flexor and extensor muscle groups.  Ankle flexion and extension intact.  Skin: Skin is warm and dry.  Small indurated nodule left axilla, no drainage or surrounding erythema, no red streaking.  Psychiatric: He has a normal mood and affect.    ED Course  Procedures (including critical care time)  Labs Reviewed - No data to display No results found.   1. Cervical strain, acute, initial encounter   2. Strain of thoracic spine, initial encounter   3. Axillary abscess       MDM  Patients labs and/or radiological studies were viewed and considered during the medical decision making and disposition process. Normal xrays and nonfocal neuro exam - patient encouraged ice tx,  Followed by heat tx starting tomorrow.  Pt has been using warm compresses to axillary abscess, encouraged to continue and to finish abx.  Prescribed flexeril and oxycodone .  Encouraged f/u with pcp prn.        Burgess Amor, PA-C 01/02/13 0026  Burgess Amor, PA-C 01/02/13 1610

## 2013-01-04 NOTE — ED Provider Notes (Signed)
Medical screening examination/treatment/procedure(s) were performed by non-physician practitioner and as supervising physician I was immediately available for consultation/collaboration. Devoria Albe, MD, FACEP   Ward Givens, MD 01/04/13 [phone]

## 2013-01-09 ENCOUNTER — Emergency Department (HOSPITAL_COMMUNITY)
Admission: EM | Admit: 2013-01-09 | Discharge: 2013-01-09 | Disposition: A | Discharge status: Home / Self Care | Payer: Medicaid Other | Attending: Emergency Medicine | Admitting: Emergency Medicine

## 2013-01-09 ENCOUNTER — Encounter (HOSPITAL_COMMUNITY): Payer: Self-pay | Admitting: Emergency Medicine

## 2013-01-09 DIAGNOSIS — I10 Essential (primary) hypertension: Secondary | ICD-10-CM | POA: Insufficient documentation

## 2013-01-09 DIAGNOSIS — E119 Type 2 diabetes mellitus without complications: Secondary | ICD-10-CM | POA: Insufficient documentation

## 2013-01-09 DIAGNOSIS — F17200 Nicotine dependence, unspecified, uncomplicated: Secondary | ICD-10-CM | POA: Insufficient documentation

## 2013-01-09 DIAGNOSIS — IMO0002 Reserved for concepts with insufficient information to code with codable children: Secondary | ICD-10-CM | POA: Insufficient documentation

## 2013-01-09 DIAGNOSIS — L0291 Cutaneous abscess, unspecified: Secondary | ICD-10-CM

## 2013-01-09 DIAGNOSIS — Z79899 Other long term (current) drug therapy: Secondary | ICD-10-CM | POA: Insufficient documentation

## 2013-01-09 MED ORDER — OXYCODONE-ACETAMINOPHEN 5-325 MG PO TABS
1.0000 | ORAL_TABLET | Freq: Once | ORAL | Status: DC
  Filled 2013-01-09: qty 1

## 2013-01-09 MED ORDER — LIDOCAINE HCL (PF) 2 % IJ SOLN
INTRAMUSCULAR | Status: AC
  Filled 2013-01-09: qty 10

## 2013-01-09 MED ORDER — OXYCODONE-ACETAMINOPHEN 5-325 MG PO TABS
1.0000 | ORAL_TABLET | Freq: Once | ORAL | Status: AC
  Administered 2013-01-09: 1 via ORAL

## 2013-01-09 NOTE — ED Notes (Signed)
Patient c/o abscess under left arm.  Patient states some stuff came out of it and it has been hurting worse.

## 2013-01-09 NOTE — ED Provider Notes (Signed)
History     CSN: [id number]  Arrival date & time 01/09/13  0008   First MD Initiated Contact with Patient 01/09/13 0215      Chief Complaint  Patient presents with  . Abscess    (Consider location/radiation/quality/duration/timing/severity/associated sxs/prior treatment) HPI Miguel Weber is a 32 y.o. male who presents to the Emergency Department complaining of pain and an abscess under his left arm that drained a little purulent material earlier in the evening. Denies feve, chills.   Past Medical History  Diagnosis Date  . Diabetes mellitus   . Hypertension     Past Surgical History  Procedure Laterality Date  . Cholecystectomy      No family history on file.  History  Substance Use Topics  . Smoking status: Current Every Day Smoker -- 1.00 packs/day    Types: Cigarettes  . Smokeless tobacco: Not on file  . Alcohol Use: No      Review of Systems  Constitutional: Negative for fever.       10 Systems reviewed and are negative for acute change except as noted in the HPI.  HENT: Negative for congestion.   Eyes: Negative for discharge and redness.  Respiratory: Negative for cough and shortness of breath.   Cardiovascular: Negative for chest pain.  Gastrointestinal: Negative for vomiting and abdominal pain.  Musculoskeletal: Negative for back pain.  Skin: Negative for rash.       Abscess under left arm  Neurological: Negative for syncope, numbness and headaches.  Psychiatric/Behavioral:       No behavior change.    Allergies  Tramadol; Aspirin; and Motrin  Home Medications   Current Outpatient Rx  Name  Route  Sig  Dispense  Refill  . acetaminophen (TYLENOL) 500 MG tablet   Oral   Take 1,000 mg by mouth every 6 (six) hours as needed. For pain         . cyclobenzaprine (FLEXERIL) 5 MG tablet   Oral   Take 1 tablet (5 mg total) by mouth 3 (three) times daily as needed for muscle spasms.   15 tablet   0   . diazepam (VALIUM) 10 MG tablet   Oral  Take 10 mg by mouth 2 (two) times daily.         Marland Kitchen lisinopril-hydrochlorothiazide (PRINZIDE,ZESTORETIC) 10-12.5 MG per tablet   Oral   Take 1 tablet by mouth daily.         . metFORMIN (GLUCOPHAGE) 500 MG tablet   Oral   Take 500 mg by mouth 2 (two) times daily.         Marland Kitchen amoxicillin (AMOXIL) 500 MG capsule   Oral   Take 2 capsules (1,000 mg total) by mouth 2 (two) times daily.   28 capsule   0   . doxycycline (VIBRAMYCIN) 100 MG capsule   Oral   Take 1 capsule (100 mg total) by mouth 2 (two) times daily.   20 capsule   0   . oxyCODONE-acetaminophen (PERCOCET/ROXICET) 5-325 MG per tablet   Oral   Take 2 tablets by mouth once as needed for pain.         Marland Kitchen oxyCODONE-acetaminophen (PERCOCET/ROXICET) 5-325 MG per tablet   Oral   Take 1 tablet by mouth every 4 (four) hours as needed for pain.   20 tablet   0     BP 140/79  Pulse 109  Temp(Src) 98.1 F (36.7 C) (Oral)  Resp 20  Ht 6\' 4"  (1.93 m)  Wt 321  lb (145.605 kg)  BMI 39.09 kg/m2  SpO2 96%  Physical Exam  Nursing note and vitals reviewed. Constitutional: He appears well-developed and well-nourished.  Awake, alert, nontoxic appearance.  HENT:  Head: Normocephalic and atraumatic.  Eyes: EOM are normal. Pupils are equal, round, and reactive to light.  Neck: Neck supple.  Cardiovascular: Normal rate and intact distal pulses.   Pulmonary/Chest: Effort normal and breath sounds normal. He exhibits no tenderness.  Abdominal: Soft. Bowel sounds are normal. There is no tenderness. There is no rebound.  Musculoskeletal: He exhibits no tenderness.  Baseline ROM, no obvious new focal weakness.  Neurological:  Mental status and motor strength appears baseline for patient and situation.  Skin: No rash noted.  Abscess 2 cm x 1 cm under left arm  Psychiatric: He has a normal mood and affect.    ED Course  Procedures (including critical care time) INCISION AND DRAINAGE Performed by: Annamarie Dawley Consent:  Verbal consent obtained. Risks and benefits: risks, benefits and alternatives were discussed Type: abscess Body area: Left axilla Anesthesia: local infiltration Incision was made with a scalpel. Local anesthetic: lidocaine 1%  W/o epinephrine Anesthetic total: 1.5 ml  Complexity: complex Blunt dissection to break up loculations Drainage: purulent Drainage amount: moderate Dressing applied. Patient tolerance: Patient tolerated the procedure well with no immediate complications.      MDM  Patient with abscess in left axilla. I&D completed. Moderate amount of purulent material. Given percocet while here. Pt stable in ED with no significant deterioration in condition.The patient appears reasonably screened and/or stabilized for discharge and I doubt any other medical condition or other [facility] requiring further screening, evaluation, or treatment in the ED at this time prior to discharge.  MDM Reviewed: nursing note and vitals           Nicoletta Dress. Colon Branch, MD 01/09/13 [phone]

## 2013-01-16 ENCOUNTER — Emergency Department (HOSPITAL_COMMUNITY)
Admission: EM | Admit: 2013-01-16 | Discharge: 2013-01-16 | Disposition: A | Discharge status: Home / Self Care | Payer: Medicaid Other | Attending: Emergency Medicine | Admitting: Emergency Medicine

## 2013-01-16 ENCOUNTER — Emergency Department (HOSPITAL_COMMUNITY): Payer: Medicaid Other

## 2013-01-16 ENCOUNTER — Encounter (HOSPITAL_COMMUNITY): Payer: Self-pay | Admitting: *Deleted

## 2013-01-16 DIAGNOSIS — F17200 Nicotine dependence, unspecified, uncomplicated: Secondary | ICD-10-CM | POA: Insufficient documentation

## 2013-01-16 DIAGNOSIS — Y9389 Activity, other specified: Secondary | ICD-10-CM | POA: Insufficient documentation

## 2013-01-16 DIAGNOSIS — S0993XA Unspecified injury of face, initial encounter: Secondary | ICD-10-CM | POA: Insufficient documentation

## 2013-01-16 DIAGNOSIS — M62838 Other muscle spasm: Secondary | ICD-10-CM | POA: Insufficient documentation

## 2013-01-16 DIAGNOSIS — M25512 Pain in left shoulder: Secondary | ICD-10-CM

## 2013-01-16 DIAGNOSIS — S46909A Unspecified injury of unspecified muscle, fascia and tendon at shoulder and upper arm level, unspecified arm, initial encounter: Secondary | ICD-10-CM | POA: Insufficient documentation

## 2013-01-16 DIAGNOSIS — Z79899 Other long term (current) drug therapy: Secondary | ICD-10-CM | POA: Insufficient documentation

## 2013-01-16 DIAGNOSIS — X500XXA Overexertion from strenuous movement or load, initial encounter: Secondary | ICD-10-CM | POA: Insufficient documentation

## 2013-01-16 DIAGNOSIS — S4980XA Other specified injuries of shoulder and upper arm, unspecified arm, initial encounter: Secondary | ICD-10-CM | POA: Insufficient documentation

## 2013-01-16 DIAGNOSIS — Y9289 Other specified places as the place of occurrence of the external cause: Secondary | ICD-10-CM | POA: Insufficient documentation

## 2013-01-16 DIAGNOSIS — I10 Essential (primary) hypertension: Secondary | ICD-10-CM | POA: Insufficient documentation

## 2013-01-16 DIAGNOSIS — W1809XA Striking against other object with subsequent fall, initial encounter: Secondary | ICD-10-CM | POA: Insufficient documentation

## 2013-01-16 DIAGNOSIS — E119 Type 2 diabetes mellitus without complications: Secondary | ICD-10-CM | POA: Insufficient documentation

## 2013-01-16 MED ORDER — OXYCODONE-ACETAMINOPHEN 5-325 MG PO TABS: 1.0000 | ORAL_TABLET | ORAL | Status: DC | PRN

## 2013-01-16 MED ORDER — METHOCARBAMOL 500 MG PO TABS
1000.0000 mg | ORAL_TABLET | Freq: Once | ORAL | Status: AC
  Administered 2013-01-16: 1000 mg via ORAL
  Filled 2013-01-16: qty 2

## 2013-01-16 MED ORDER — DIAZEPAM 5 MG PO TABS: ORAL_TABLET | ORAL | Status: DC

## 2013-01-16 MED ORDER — OXYCODONE-ACETAMINOPHEN 5-325 MG PO TABS
1.0000 | ORAL_TABLET | Freq: Once | ORAL | Status: AC
  Administered 2013-01-16: 1 via ORAL
  Filled 2013-01-16: qty 1

## 2013-01-16 MED ORDER — OXYCODONE-ACETAMINOPHEN 7.5-325 MG PO TABS: 1.0000 | ORAL_TABLET | ORAL | Status: DC | PRN

## 2013-01-16 MED ORDER — METHOCARBAMOL 500 MG PO TABS: 1000.0000 mg | ORAL_TABLET | Freq: Three times a day (TID) | ORAL | Status: DC

## 2013-01-16 NOTE — ED Notes (Signed)
C/o left shoulder pain x 2 days after injury - fell landing on left shoulder and heard a pop.

## 2013-01-19 NOTE — ED Provider Notes (Signed)
History     CSN: [id number]  Arrival date & time 01/16/13  1747   First MD Initiated Contact with Patient 01/16/13 1917      Chief Complaint  Patient presents with  . Shoulder Pain    (Consider location/radiation/quality/duration/timing/severity/associated sxs/prior treatment) HPI Comments: Miguel Weber is a 32 y.o. Male presenting with left shoulder pain and muscle spasm after falling and landing on the shoulder while trying to catch a tv that was toppling off the back end of a pickup truck 2 days ago.  He felt and heard a popping sensation when he landed on the ground. Pain is worsened with palpation and range of motion.  He denies numbness or tingling in his left arm and denies pain in his chest or collarbone although he feels the muscles in his shoulder are spasming.  He has taken his normal valium 10 mg bid for his chronic anxiety disorder which is not relieving spasm. He also took an oxycodone tablet left over from a prescription obtained last month which was not helpful either.    The history is provided by the patient.    Past Medical History  Diagnosis Date  . Diabetes mellitus   . Hypertension     Past Surgical History  Procedure Laterality Date  . Cholecystectomy      No family history on file.  History  Substance Use Topics  . Smoking status: Current Every Day Smoker -- 1.00 packs/day    Types: Cigarettes  . Smokeless tobacco: Not on file  . Alcohol Use: No      Review of Systems  Constitutional: Negative for fever.  HENT: Positive for neck pain.   Respiratory: Negative for shortness of breath.   Cardiovascular: Negative for chest pain.  Musculoskeletal: Positive for arthralgias. Negative for myalgias and joint swelling.  Neurological: Negative for weakness and numbness.    Allergies  Tramadol; Aspirin; and Motrin  Home Medications   Current Outpatient Rx  Name  Route  Sig  Dispense  Refill  . acetaminophen (TYLENOL) 500 MG tablet   Oral  Take 1,000 mg by mouth every 6 (six) hours as needed. For pain         . diazepam (VALIUM) 10 MG tablet   Oral   Take 10 mg by mouth 2 (two) times daily.         Marland Kitchen lisinopril-hydrochlorothiazide (PRINZIDE,ZESTORETIC) 10-12.5 MG per tablet   Oral   Take 1 tablet by mouth daily.         . metFORMIN (GLUCOPHAGE) 500 MG tablet   Oral   Take 500 mg by mouth 2 (two) times daily.         Marland Kitchen oxyCODONE-acetaminophen (PERCOCET/ROXICET) 5-325 MG per tablet   Oral   Take 1 tablet by mouth every 4 (four) hours as needed for pain.   20 tablet   0   . diazepam (VALIUM) 5 MG tablet      Take one additional valium daily,  Every 8 hours for the next 5 days.   5 tablet   0   . oxyCODONE-acetaminophen (PERCOCET) 7.5-325 MG per tablet   Oral   Take 1 tablet by mouth every 4 (four) hours as needed for pain.   15 tablet   0     BP 140/80  Pulse 84  Temp(Src) 98.3 F (36.8 C) (Oral)  Resp 20  Ht 6\' 4"  (1.93 m)  Wt 325 lb (147.419 kg)  BMI 39.58 kg/m2  SpO2 97%  Physical Exam  Constitutional: He appears well-developed and well-nourished.  HENT:  Head: Atraumatic.  Neck: Normal range of motion. Muscular tenderness present. No spinous process tenderness present.    Significant left trapezius muscle spasm.  Cardiovascular:  Pulses:      Radial pulses are 2+ on the right side, and 2+ on the left side.  Pulses equal bilaterally  Musculoskeletal: He exhibits tenderness.       Left shoulder: He exhibits decreased range of motion, bony tenderness and spasm. He exhibits no swelling, no effusion, no crepitus, no deformity, normal pulse and normal strength.  TTP lateral and posterior humeral head.  No clavicle pain or deformity.    Neurological: He is alert. He has normal strength. He displays normal reflexes. No sensory deficit.  Reflex Scores:      Bicep reflexes are 2+ on the right side and 2+ on the left side. Equal grip strength  Skin: Skin is warm and dry.  Psychiatric: He  has a normal mood and affect.    ED Course  Procedures (including critical care time)  Labs Reviewed - No data to display No results found.   1. Shoulder pain, acute, left   2. Muscle spasm of left shoulder area       MDM  Patients labs and/or radiological studies were viewed and considered during the medical decision making and disposition process.  Pt prescribed valium 5 mg ,  Pt already takes bid,  Encouraged to take tid for the next 5 days in addition to heat therapy for muscle spasm. He was provided an arm sling.  Also increased oxycodone to 7.5 mg for the next 2 days.  Advised to f/u with ortho prn,  Referral given.    The patient appears reasonably screened and/or stabilized for discharge and I doubt any other medical condition or other [facility] requiring further screening, evaluation, or treatment in the ED at this time prior to discharge.         Burgess Amor, PA-C 01/19/13 1228

## 2013-01-20 NOTE — ED Provider Notes (Signed)
Medical screening examination/treatment/procedure(s) were performed by non-physician practitioner and as supervising physician I was immediately available for consultation/collaboration.   Chevis Weisensel M Floreen Teegarden, DO 01/20/13 0023 

## 2013-01-27 ENCOUNTER — Emergency Department (HOSPITAL_COMMUNITY)
Admission: EM | Admit: 2013-01-27 | Discharge: 2013-01-27 | Disposition: A | Discharge status: Home / Self Care | Payer: Medicaid Other | Attending: Emergency Medicine | Admitting: Emergency Medicine

## 2013-01-27 ENCOUNTER — Encounter (HOSPITAL_COMMUNITY): Payer: Self-pay | Admitting: *Deleted

## 2013-01-27 DIAGNOSIS — M25512 Pain in left shoulder: Secondary | ICD-10-CM

## 2013-01-27 DIAGNOSIS — M25519 Pain in unspecified shoulder: Secondary | ICD-10-CM | POA: Insufficient documentation

## 2013-01-27 DIAGNOSIS — G8911 Acute pain due to trauma: Secondary | ICD-10-CM | POA: Insufficient documentation

## 2013-01-27 DIAGNOSIS — F17200 Nicotine dependence, unspecified, uncomplicated: Secondary | ICD-10-CM | POA: Insufficient documentation

## 2013-01-27 DIAGNOSIS — E119 Type 2 diabetes mellitus without complications: Secondary | ICD-10-CM | POA: Insufficient documentation

## 2013-01-27 DIAGNOSIS — Z79899 Other long term (current) drug therapy: Secondary | ICD-10-CM | POA: Insufficient documentation

## 2013-01-27 DIAGNOSIS — I10 Essential (primary) hypertension: Secondary | ICD-10-CM | POA: Insufficient documentation

## 2013-01-27 LAB — GLUCOSE, CAPILLARY: Glucose-Capillary: 212 mg/dL — ABNORMAL HIGH (ref 70–99)

## 2013-01-27 MED ORDER — OXYCODONE-ACETAMINOPHEN 5-325 MG PO TABS
1.0000 | ORAL_TABLET | Freq: Once | ORAL | Status: AC
  Administered 2013-01-27: 1 via ORAL
  Filled 2013-01-27: qty 1

## 2013-01-27 MED ORDER — ONDANSETRON 8 MG PO TBDP: 8.0000 mg | ORAL_TABLET | Freq: Two times a day (BID) | ORAL | Status: DC | PRN

## 2013-01-27 MED ORDER — HYDROCODONE-ACETAMINOPHEN 5-325 MG PO TABS: 1.0000 | ORAL_TABLET | ORAL | Status: DC | PRN

## 2013-01-27 NOTE — ED Provider Notes (Signed)
History     CSN: [id number]  Arrival date & time 01/27/13  1549   First MD Initiated Contact with Patient 01/27/13 1614      Chief Complaint  Patient presents with  . Shoulder Pain    (Consider location/radiation/quality/duration/timing/severity/associated sxs/prior treatment) HPI  Miguel Weber is a 32 y.o. male who presents to the ED with shoulder pain. The pain is located in the left shoulder. The pain started about 2 weeks ago when he was lifting a TV. He came to the ED and was treated for muscle strain with Oxycodone. He states that he called the Orthopedic doctor but they would not see him without a referral from his PCP. He called his PCP and schedule appointment for 02/09/2013. He came in today for pain management because he is out of his medication. He has had multiple ER visits for pain or injuries requiring narcotics. The history was provided by the patient and his medical record.   Past Medical History  Diagnosis Date  . Diabetes mellitus   . Hypertension     Past Surgical History  Procedure Laterality Date  . Cholecystectomy      History reviewed. No pertinent family history.  History  Substance Use Topics  . Smoking status: Current Every Day Smoker -- 1.00 packs/day    Types: Cigarettes  . Smokeless tobacco: Not on file  . Alcohol Use: No      Review of Systems  Constitutional: Negative for chills and unexpected weight change.  HENT: Negative.   Respiratory: Negative for cough and wheezing.   Gastrointestinal: Negative for nausea, vomiting, abdominal pain and diarrhea.  Musculoskeletal:       Left shoulder pain  Skin: Negative for rash.  Allergic/Immunologic: Negative for food allergies.  Psychiatric/Behavioral: The patient is not nervous/anxious.     Allergies  Tramadol; Aspirin; and Motrin  Home Medications   Current Outpatient Rx  Name  Route  Sig  Dispense  Refill  . acetaminophen (TYLENOL) 500 MG tablet   Oral   Take 1,000 mg by mouth every  6 (six) hours as needed. For pain         . diazepam (VALIUM) 10 MG tablet   Oral   Take 10 mg by mouth 2 (two) times daily.         . diazepam (VALIUM) 5 MG tablet      Take one additional valium daily,  Every 8 hours for the next 5 days.   5 tablet   0   . lisinopril-hydrochlorothiazide (PRINZIDE,ZESTORETIC) 10-12.5 MG per tablet   Oral   Take 1 tablet by mouth daily.         . metFORMIN (GLUCOPHAGE) 500 MG tablet   Oral   Take 500 mg by mouth 2 (two) times daily.         Marland Kitchen oxyCODONE-acetaminophen (PERCOCET) 7.5-325 MG per tablet   Oral   Take 1 tablet by mouth every 4 (four) hours as needed for pain.   15 tablet   0   . oxyCODONE-acetaminophen (PERCOCET/ROXICET) 5-325 MG per tablet   Oral   Take 1 tablet by mouth every 4 (four) hours as needed for pain.   20 tablet   0     BP 165/91  Pulse 100  SpO2 96%  Physical Exam  Nursing note and vitals reviewed. Constitutional: He is oriented to person, place, and time. He appears well-developed and well-nourished. No distress.  Eyes: EOM are normal.  Neck: Neck supple.  Cardiovascular: Normal  rate, regular rhythm and normal heart sounds.   Pulmonary/Chest: Effort normal and breath sounds normal. No respiratory distress.  Abdominal: Soft. There is no tenderness.  Musculoskeletal:       Left shoulder: He exhibits decreased range of motion and tenderness. He exhibits no swelling, no deformity, no laceration, no spasm, normal pulse and normal strength.       Arms: Patient has increased pain with raising his arm over his head.   Neurological: He is alert and oriented to person, place, and time. He has normal strength. No cranial nerve deficit or sensory deficit.  Although patient states that sometimes he wakes with tingling in his left hand he has good strength, adequate circulation and strong radial pulse. Good touch sensation.   Skin: Skin is warm and dry.  Psychiatric: He has a normal mood and affect. His behavior  is normal. Judgment and thought content normal.   Assessment: 32 y.o. male with left shoulder pain follow up visit  Plan:  I will give the patient Hydrocodone # 10 for his pain and he can continue muscle relaxants as needed. He is to ice the area and follow up with his PCP.    ED Course  Procedures  Patient request that I change the Hydrocodone Rx to Percocet because some times hydrocodone makes him nauseated.  I told him I will give him Rx for Zofran in case of nausea.  MDM  I have reviewed this patient's vital signs, nurses notes, appropriate labs and imaging from previous visits and discussed my findings and plan of care with the patient. He is stable for discharge home.    Medication List    TAKE these medications       HYDROcodone-acetaminophen 5-325 MG per tablet  Commonly known as:  NORCO/VICODIN  Take 1 tablet by mouth every 4 (four) hours as needed.     ondansetron 8 MG disintegrating tablet  Commonly known as:  ZOFRAN ODT  Take 1 tablet (8 mg total) by mouth every 12 (twelve) hours as needed for nausea.      ASK your doctor about these medications       acetaminophen 500 MG tablet  Commonly known as:  TYLENOL  Take 1,000 mg by mouth every 6 (six) hours as needed. For pain     diazepam 5 MG tablet  Commonly known as:  VALIUM  Take one additional valium daily,  Every 8 hours for the next 5 days.     diazepam 10 MG tablet  Commonly known as:  VALIUM  Take 10 mg by mouth 2 (two) times daily.     lisinopril-hydrochlorothiazide 10-12.5 MG per tablet  Commonly known as:  PRINZIDE,ZESTORETIC  Take 1 tablet by mouth daily.     metFORMIN 500 MG tablet  Commonly known as:  GLUCOPHAGE  Take 500 mg by mouth 2 (two) times daily.     oxyCODONE-acetaminophen 7.5-325 MG per tablet  Commonly known as:  PERCOCET  Take 1 tablet by mouth every 4 (four) hours as needed for pain.        [place], NP 01/27/13 [address], NP 01/27/13 1642

## 2013-01-27 NOTE — ED Notes (Signed)
Pt with continued left shoulder pain, states that he injured it while moving a TV 2 weeks ago

## 2013-01-27 NOTE — ED Provider Notes (Signed)
Medical screening examination/treatment/procedure(s) were performed by non-physician practitioner and as supervising physician I was immediately available for consultation/collaboration.   Loren Racer, MD 01/27/13 2204

## 2013-01-27 NOTE — ED Notes (Signed)
cbg 212 in triage room

## 2013-02-08 ENCOUNTER — Encounter (HOSPITAL_COMMUNITY): Payer: Self-pay

## 2013-02-08 ENCOUNTER — Emergency Department (HOSPITAL_COMMUNITY)
Admission: EM | Admit: 2013-02-08 | Discharge: 2013-02-08 | Disposition: A | Discharge status: Home / Self Care | Payer: Medicaid Other | Attending: Emergency Medicine | Admitting: Emergency Medicine

## 2013-02-08 DIAGNOSIS — IMO0002 Reserved for concepts with insufficient information to code with codable children: Secondary | ICD-10-CM | POA: Insufficient documentation

## 2013-02-08 DIAGNOSIS — I10 Essential (primary) hypertension: Secondary | ICD-10-CM | POA: Insufficient documentation

## 2013-02-08 DIAGNOSIS — E1169 Type 2 diabetes mellitus with other specified complication: Secondary | ICD-10-CM | POA: Insufficient documentation

## 2013-02-08 DIAGNOSIS — F17200 Nicotine dependence, unspecified, uncomplicated: Secondary | ICD-10-CM | POA: Insufficient documentation

## 2013-02-08 DIAGNOSIS — L02412 Cutaneous abscess of left axilla: Secondary | ICD-10-CM

## 2013-02-08 DIAGNOSIS — R739 Hyperglycemia, unspecified: Secondary | ICD-10-CM

## 2013-02-08 DIAGNOSIS — R509 Fever, unspecified: Secondary | ICD-10-CM | POA: Insufficient documentation

## 2013-02-08 DIAGNOSIS — Z79899 Other long term (current) drug therapy: Secondary | ICD-10-CM | POA: Insufficient documentation

## 2013-02-08 LAB — CBC WITH DIFFERENTIAL/PLATELET
Basophils Absolute: 0.1 10*3/uL (ref 0.0–0.1)
Basophils Relative: 1 % (ref 0–1)
MCHC: 36.5 g/dL — ABNORMAL HIGH (ref 30.0–36.0)
Neutro Abs: 4.4 10*3/uL (ref 1.7–7.7)
Neutrophils Relative %: 49 % (ref 43–77)
Platelets: 278 10*3/uL (ref 150–400)
RDW: 12.5 % (ref 11.5–15.5)

## 2013-02-08 LAB — BASIC METABOLIC PANEL
Chloride: 98 mEq/L (ref 96–112)
GFR calc Af Amer: >90 mL/min (ref >90)
Potassium: 3.6 mEq/L (ref 3.5–5.1)

## 2013-02-08 MED ORDER — ONDANSETRON HCL 4 MG/2ML IJ SOLN
4.0000 mg | Freq: Once | INTRAMUSCULAR | Status: AC
  Administered 2013-02-08: 4 mg via INTRAVENOUS
  Filled 2013-02-08: qty 2

## 2013-02-08 MED ORDER — SULFAMETHOXAZOLE-TRIMETHOPRIM 800-160 MG PO TABS: 1.0000 | ORAL_TABLET | Freq: Two times a day (BID) | ORAL | Status: DC

## 2013-02-08 MED ORDER — OXYCODONE-ACETAMINOPHEN 5-325 MG PO TABS: 1.0000 | ORAL_TABLET | Freq: Once | ORAL | Status: AC

## 2013-02-08 MED ORDER — OXYCODONE-ACETAMINOPHEN 5-325 MG PO TABS
ORAL_TABLET | ORAL | Status: AC
  Administered 2013-02-08: 1 via ORAL
  Filled 2013-02-08: qty 1

## 2013-02-08 MED ORDER — HYDROMORPHONE HCL PF 1 MG/ML IJ SOLN
1.0000 mg | Freq: Once | INTRAMUSCULAR | Status: AC
  Administered 2013-02-08: 1 mg via INTRAVENOUS
  Filled 2013-02-08: qty 1

## 2013-02-08 MED ORDER — OXYCODONE-ACETAMINOPHEN 5-325 MG PO TABS: 1.0000 | ORAL_TABLET | ORAL | Status: DC | PRN

## 2013-02-08 MED ORDER — SODIUM CHLORIDE 0.9 % IV SOLN
Freq: Once | INTRAVENOUS | Status: AC
  Administered 2013-02-08: 17:00:00 via INTRAVENOUS

## 2013-02-08 MED ORDER — ONDANSETRON HCL 4 MG PO TABS: 4.0000 mg | ORAL_TABLET | Freq: Four times a day (QID) | ORAL | Status: DC

## 2013-02-08 NOTE — ED Notes (Signed)
Pt reports ? abcess to left axillary areax 1 month, has been draining, +nausea and vomiting, ?fever last night at home now having pain all over left side of body

## 2013-02-08 NOTE — ED Notes (Signed)
Patient with no complaints at this time. Respirations even and unlabored. Skin warm/dry. Discharge instructions reviewed with patient at this time. Patient given opportunity to voice concerns/ask questions. IV removed per policy and band-aid applied to site. Patient discharged at this time and left Emergency Department with steady gait.  

## 2013-02-11 NOTE — ED Provider Notes (Signed)
History     CSN: [id number]  Arrival date & time 02/08/13  1541   First MD Initiated Contact with Patient 02/08/13 1619      Chief Complaint  Patient presents with  . Wound Check  . Nausea  . Emesis  . Fever    (Consider location/radiation/quality/duration/timing/severity/associated sxs/prior treatment) HPI Comments: Patient c/o diffuse myalgias, nausea and one episode of vomiting on the night prior to ED arrival.  States that he has multiple "boils" to the left axilla that have been present for one month.  States they have been improving then return.  Hx of same.  States he had one I&D'd here recently.  He denies abdominal pain, persistent vomiting or shortness of breath.   Patient is a 32 y.o. male presenting with wound check and vomiting. The history is provided by the patient.  Wound Check This is a recurrent problem. The current episode started 1 to 4 weeks ago. The problem occurs constantly. The problem has been unchanged. Associated symptoms include nausea and vomiting. Pertinent negatives include no abdominal pain, arthralgias, chest pain, chills, fever, headaches, joint swelling, neck pain, numbness, swollen glands, urinary symptoms or weakness. Nothing aggravates the symptoms. He has tried nothing for the symptoms. The treatment provided no relief.  Emesis Severity:  Mild Timing:  Rare Quality:  Stomach contents Chronicity:  New Recent urination:  Normal Relieved by:  Nothing Worsened by:  Nothing tried Ineffective treatments:  None tried Associated symptoms: no abdominal pain, no arthralgias, no chills and no headaches   Risk factors: diabetes     Past Medical History  Diagnosis Date  . Diabetes mellitus   . Hypertension     Past Surgical History  Procedure Laterality Date  . Cholecystectomy      No family history on file.  History  Substance Use Topics  . Smoking status: Current Every Day Smoker -- 1.00 packs/day    Types: Cigarettes  . Smokeless  tobacco: Not on file  . Alcohol Use: No      Review of Systems  Constitutional: Negative for fever, chills, activity change and appetite change.  HENT: Negative for neck pain.   Respiratory: Negative for chest tightness and shortness of breath.   Cardiovascular: Negative for chest pain.  Gastrointestinal: Positive for nausea and vomiting. Negative for abdominal pain.  Genitourinary: Negative for dysuria and flank pain.  Musculoskeletal: Negative for joint swelling and arthralgias.  Skin: Positive for color change.       Abscess   Neurological: Negative for dizziness, facial asymmetry, weakness, numbness and headaches.  Hematological: Negative for adenopathy.  All other systems reviewed and are negative.    Allergies  Tramadol; Aspirin; and Motrin  Home Medications   Current Outpatient Rx  Name  Route  Sig  Dispense  Refill  . acetaminophen (TYLENOL) 500 MG tablet   Oral   Take 1,000 mg by mouth every 6 (six) hours as needed. For pain         . diazepam (VALIUM) 10 MG tablet   Oral   Take 10 mg by mouth 2 (two) times daily.         Marland Kitchen lisinopril-hydrochlorothiazide (PRINZIDE,ZESTORETIC) 10-12.5 MG per tablet   Oral   Take 1 tablet by mouth daily.         . metFORMIN (GLUCOPHAGE) 500 MG tablet   Oral   Take 500 mg by mouth 2 (two) times daily.         . ondansetron (ZOFRAN) 4 MG tablet  Oral   Take 1 tablet (4 mg total) by mouth every 6 (six) hours.   12 tablet   0   . oxyCODONE-acetaminophen (PERCOCET/ROXICET) 5-325 MG per tablet   Oral   Take 1 tablet by mouth every 4 (four) hours as needed for pain.   20 tablet   0   . sulfamethoxazole-trimethoprim (SEPTRA DS) 800-160 MG per tablet   Oral   Take 1 tablet by mouth 2 (two) times daily. For 14 days   28 tablet   0     BP 144/100  Pulse 90  Temp(Src) 97.3 F (36.3 C) (Oral)  Resp 24  Ht 6\' 4"  (1.93 m)  Wt 325 lb (147.419 kg)  BMI 39.58 kg/m2  SpO2 100%  Physical Exam  Nursing note and  vitals reviewed. Constitutional: He is oriented to person, place, and time. He appears well-developed and well-nourished. No distress.  HENT:  Head: Normocephalic and atraumatic.  Mouth/Throat: Oropharynx is clear and moist.  Neck: Normal range of motion. Neck supple.  Cardiovascular: Normal rate, regular rhythm, normal heart sounds and intact distal pulses.   No murmur heard. Pulmonary/Chest: Effort normal and breath sounds normal. No respiratory distress.  Abdominal: Soft. He exhibits no distension and no mass. There is no tenderness. There is no rebound and no guarding.  Musculoskeletal: Normal range of motion.  Lymphadenopathy:    He has no cervical adenopathy.  Neurological: He is alert and oriented to person, place, and time. He exhibits normal muscle tone. Coordination normal.  Skin: Skin is warm. There is erythema.  Several small pustules to the left axilla.  No surrounding erythema or red streaks.  One abscess is draining small amt of purulent material.      ED Course  Procedures (including critical care time)  Labs Reviewed  CBC WITH DIFFERENTIAL - Abnormal; Notable for the following:    MCHC 36.5 (*)    All other components within normal limits  BASIC METABOLIC PANEL - Abnormal; Notable for the following:    Sodium 134 (*)    Glucose, Bld 213 (*)    All other components within normal limits     1. Abscess of axilla, left   2. Hyperglycemia       MDM   Patient is alert, non-toxic appearing. VSS.   Has received IVF's, pain medication.  No clinical signs of DKA.  Bicarb is wnml.    Patient is requesting d/c home.  States he is feeling better..  Will treat with bactrim, likely MRSA infection.  Pustules are small and I&D is not indicated at this time.  Pt agrees to return here if sx's worsen.  He appears stable for discharge.         Sundance Moise L. Trisha Mangle, PA-C 02/11/13 2327

## 2013-02-15 NOTE — ED Provider Notes (Signed)
Medical screening examination/treatment/procedure(s) were performed by non-physician practitioner and as supervising physician I was immediately available for consultation/collaboration.  Kesley Gaffey, MD 02/15/13 1533 

## 2013-02-19 ENCOUNTER — Encounter (HOSPITAL_COMMUNITY): Payer: Self-pay | Admitting: *Deleted

## 2013-02-19 ENCOUNTER — Emergency Department (HOSPITAL_COMMUNITY): Payer: Medicaid Other

## 2013-02-19 ENCOUNTER — Emergency Department (HOSPITAL_COMMUNITY)
Admission: EM | Admit: 2013-02-19 | Discharge: 2013-02-19 | Disposition: A | Discharge status: Home / Self Care | Payer: Medicaid Other | Attending: Emergency Medicine | Admitting: Emergency Medicine

## 2013-02-19 DIAGNOSIS — N433 Hydrocele, unspecified: Secondary | ICD-10-CM | POA: Insufficient documentation

## 2013-02-19 DIAGNOSIS — I10 Essential (primary) hypertension: Secondary | ICD-10-CM | POA: Insufficient documentation

## 2013-02-19 DIAGNOSIS — E119 Type 2 diabetes mellitus without complications: Secondary | ICD-10-CM | POA: Insufficient documentation

## 2013-02-19 DIAGNOSIS — Z79899 Other long term (current) drug therapy: Secondary | ICD-10-CM | POA: Insufficient documentation

## 2013-02-19 DIAGNOSIS — R300 Dysuria: Secondary | ICD-10-CM | POA: Insufficient documentation

## 2013-02-19 DIAGNOSIS — F17200 Nicotine dependence, unspecified, uncomplicated: Secondary | ICD-10-CM | POA: Insufficient documentation

## 2013-02-19 LAB — URINALYSIS, ROUTINE W REFLEX MICROSCOPIC
Ketones, ur: NEGATIVE mg/dL
Leukocytes, UA: NEGATIVE
Nitrite: NEGATIVE
Specific Gravity, Urine: >1.03 — ABNORMAL HIGH (ref 1.005–1.030)
Urobilinogen, UA: 0.2 mg/dL (ref 0.0–1.0)
pH: 5.5 (ref 5.0–8.0)

## 2013-02-19 LAB — URINE MICROSCOPIC-ADD ON

## 2013-02-19 MED ORDER — HYDROMORPHONE HCL PF 2 MG/ML IJ SOLN
2.0000 mg | Freq: Once | INTRAMUSCULAR | Status: AC
  Administered 2013-02-19: 2 mg via INTRAMUSCULAR
  Filled 2013-02-19: qty 1

## 2013-02-19 NOTE — ED Provider Notes (Signed)
History  This chart was scribed for Joya Gaskins, MD,  by Concha Se, ED Scribe. The patient was seen in room APA08/[facility]08 and the patient's care was started at 6:49 PM.   CSN: [id number]  Arrival date & time 02/19/13  1752   Chief Complaint  Patient presents with  . Testicle Pain  . Groin Pain   Patient is a 32 y.o. male presenting with groin pain. The history is provided by the patient and medical records. No language interpreter was used.  Groin Pain This is a new problem. The current episode started yesterday. The problem occurs constantly. The problem has been gradually worsening. Pertinent negatives include no abdominal pain. Nothing aggravates the symptoms. Nothing relieves the symptoms. He has tried nothing for the symptoms. The treatment provided no relief.   HPI Comments: Miguel Weber is a 32 y.o. male who presents to the Emergency Department complaining of constant, suddent onset of pain in his testicles and groin (mainly in scrotum) after picking up his son yesterday. Pt noticed a knot to the testicles that he reports he felt "pop" and states the pain became much worse after that. He reports no injury or trauma to the testicles. Pt states mild dysuria.Pt denies drainage from the area,  fever, chills, nausea, vomiting, diarrhea, weakness, cough, SOB and any other pain. He states he has tried nothing for pain.      Past Medical History  Diagnosis Date  . Diabetes mellitus   . Hypertension     Past Surgical History  Procedure Laterality Date  . Cholecystectomy      History reviewed. No pertinent family history.  History  Substance Use Topics  . Smoking status: Current Every Day Smoker -- 1.00 packs/day    Types: Cigarettes  . Smokeless tobacco: Not on file  . Alcohol Use: No      Review of Systems  Gastrointestinal: Negative for abdominal pain.  Genitourinary: Positive for dysuria (mild dysuria) and testicular pain.  All other systems reviewed and are  negative.    Allergies  Tramadol; Aspirin; and Motrin  Home Medications   Current Outpatient Rx  Name  Route  Sig  Dispense  Refill  . acetaminophen (TYLENOL) 500 MG tablet   Oral   Take 1,000 mg by mouth every 6 (six) hours as needed. For pain         . diazepam (VALIUM) 10 MG tablet   Oral   Take 10 mg by mouth 2 (two) times daily.         Marland Kitchen lisinopril-hydrochlorothiazide (PRINZIDE,ZESTORETIC) 10-12.5 MG per tablet   Oral   Take 1 tablet by mouth daily.         . metFORMIN (GLUCOPHAGE) 500 MG tablet   Oral   Take 500 mg by mouth 2 (two) times daily.         . ondansetron (ZOFRAN) 4 MG tablet   Oral   Take 1 tablet (4 mg total) by mouth every 6 (six) hours.   12 tablet   0   . oxyCODONE-acetaminophen (PERCOCET/ROXICET) 5-325 MG per tablet   Oral   Take 1 tablet by mouth every 4 (four) hours as needed for pain.   20 tablet   0   . sulfamethoxazole-trimethoprim (SEPTRA DS) 800-160 MG per tablet   Oral   Take 1 tablet by mouth 2 (two) times daily. For 14 days   28 tablet   0     BP 159/105  Pulse 90  Temp(Src) 98.6 F (  37 C) (Oral)  Ht 6\' 4"  (1.93 m)  Wt 330 lb (149.687 kg)  BMI 40.19 kg/m2  SpO2 96%  Physical Exam CONSTITUTIONAL: Well developed/well nourished HEAD: Normocephalic/atraumatic EYES: EOMI/PERRL ENMT: Mucous membranes moist NECK: supple no meningeal signs SPINE:entire spine nontender CV: S1/S2 noted, no murmurs/rubs/gallops noted LUNGS: Lungs are clear to auscultation bilaterally, no apparent distress ABDOMEN: soft, nontender, no rebound or guarding GU:no cva tenderness. Left testicular tenderness noted but no overlying erythema/edema to scrotum. No lesions/erythema noted to penis   No inguinal lymphadenopathy. No scrotal nodules noted  No tenderness/erythema to perineum.  No tenderness/abscess noted to perirectal area Chaperone present NEURO: Pt is awake/alert, moves all extremitiesx4 EXTREMITIES: pulses normal, full ROM SKIN:  warm, color normal PSYCH: no abnormalities of mood noted  ED Course  Procedures  DIAGNOSTIC STUDIES: Oxygen Saturation is 96% on room air, low by my interpretation.    COORDINATION OF CARE: 6:08 PM Discussed ED treatment with pt and pt agrees to treatment plan.  Labs Reviewed  URINALYSIS, ROUTINE W REFLEX MICROSCOPIC - Abnormal; Notable for the following:    Specific Gravity, Urine >1.030 (*)    Hgb urine dipstick TRACE (*)    All other components within normal limits  URINE MICROSCOPIC-ADD ON   Will obtain emergent [place] to r/o testicular torsion given pain started abruptly yesterday while picking up child No signs of perirectal abscess or abscess to perineum/scrotum   9:06 PM Ultrasound negative for acute disease Pt resting comfortably Advised rest, limit lifting and tylenol for pain  MDM  Nursing notes including past medical history and social history reviewed and considered in documentation Labs/vital reviewed and considered       I personally performed the services described in this documentation, which was scribed in my presence. The recorded information has been reviewed and is accurate.     Joya Gaskins, MD 02/19/13 2107

## 2013-02-19 NOTE — ED Notes (Signed)
Pt very upset that he is not receiving pain med prescription, states he is in severe pain. This nurse again verified per [name] that pt would not be receiving further pain med rx.

## 2013-02-19 NOTE — ED Notes (Signed)
Pt states noticed a lump between testicle and anus, worse today, pt states he felt a pop and has had pain 10/10 since that time.

## 2013-02-19 NOTE — ED Notes (Signed)
Patient complaining is in a lot of pain

## 2013-02-26 ENCOUNTER — Emergency Department (HOSPITAL_COMMUNITY)
Admission: EM | Admit: 2013-02-26 | Discharge: 2013-02-26 | Disposition: A | Discharge status: Home / Self Care | Payer: Medicaid Other | Attending: Emergency Medicine | Admitting: Emergency Medicine

## 2013-02-26 ENCOUNTER — Encounter (HOSPITAL_COMMUNITY): Payer: Self-pay | Admitting: Emergency Medicine

## 2013-02-26 ENCOUNTER — Emergency Department (HOSPITAL_COMMUNITY): Payer: Medicaid Other

## 2013-02-26 DIAGNOSIS — E119 Type 2 diabetes mellitus without complications: Secondary | ICD-10-CM | POA: Insufficient documentation

## 2013-02-26 DIAGNOSIS — Y9289 Other specified places as the place of occurrence of the external cause: Secondary | ICD-10-CM | POA: Insufficient documentation

## 2013-02-26 DIAGNOSIS — S4980XA Other specified injuries of shoulder and upper arm, unspecified arm, initial encounter: Secondary | ICD-10-CM | POA: Insufficient documentation

## 2013-02-26 DIAGNOSIS — I10 Essential (primary) hypertension: Secondary | ICD-10-CM | POA: Insufficient documentation

## 2013-02-26 DIAGNOSIS — S46909A Unspecified injury of unspecified muscle, fascia and tendon at shoulder and upper arm level, unspecified arm, initial encounter: Secondary | ICD-10-CM | POA: Insufficient documentation

## 2013-02-26 DIAGNOSIS — Z79899 Other long term (current) drug therapy: Secondary | ICD-10-CM | POA: Insufficient documentation

## 2013-02-26 DIAGNOSIS — Y9389 Activity, other specified: Secondary | ICD-10-CM | POA: Insufficient documentation

## 2013-02-26 DIAGNOSIS — F17200 Nicotine dependence, unspecified, uncomplicated: Secondary | ICD-10-CM | POA: Insufficient documentation

## 2013-02-26 DIAGNOSIS — Z87828 Personal history of other (healed) physical injury and trauma: Secondary | ICD-10-CM | POA: Insufficient documentation

## 2013-02-26 DIAGNOSIS — X500XXA Overexertion from strenuous movement or load, initial encounter: Secondary | ICD-10-CM | POA: Insufficient documentation

## 2013-02-26 DIAGNOSIS — M25512 Pain in left shoulder: Secondary | ICD-10-CM

## 2013-02-26 HISTORY — DX: Unspecified injury of shoulder and upper arm, unspecified arm, initial encounter: S49.90XA

## 2013-02-26 MED ORDER — PREDNISONE 10 MG PO TABS: ORAL_TABLET | ORAL | Status: DC

## 2013-02-26 MED ORDER — ONDANSETRON HCL 4 MG PO TABS
4.0000 mg | ORAL_TABLET | Freq: Once | ORAL | Status: AC
  Administered 2013-02-26: 4 mg via ORAL
  Filled 2013-02-26: qty 1

## 2013-02-26 MED ORDER — HYDROCODONE-ACETAMINOPHEN 5-325 MG PO TABS
2.0000 | ORAL_TABLET | Freq: Once | ORAL | Status: AC
  Administered 2013-02-26: 2 via ORAL
  Filled 2013-02-26: qty 2

## 2013-02-26 MED ORDER — PREDNISONE 50 MG PO TABS
60.0000 mg | ORAL_TABLET | Freq: Once | ORAL | Status: AC
  Administered 2013-02-26: 60 mg via ORAL
  Filled 2013-02-26: qty 1

## 2013-02-26 MED ORDER — HYDROCODONE-ACETAMINOPHEN 5-325 MG PO TABS: 1.0000 | ORAL_TABLET | ORAL | Status: DC | PRN

## 2013-02-26 NOTE — ED Notes (Signed)
States that he has a history of an old left shoulder injury and was helping a friend move wood yesterday and may have re injured his shoulder.

## 2013-02-27 NOTE — ED Provider Notes (Signed)
History     CSN: [id number]  Arrival date & time 02/26/13  1613   First MD Initiated Contact with Patient 02/26/13 1621      Chief Complaint  Patient presents with  . Shoulder Injury    (Consider location/radiation/quality/duration/timing/severity/associated sxs/prior treatment) Patient is a 32 y.o. male presenting with shoulder injury. The history is provided by the patient.  Shoulder Injury This is a chronic problem. The current episode started yesterday. The problem occurs constantly. The problem has been unchanged. Associated symptoms include arthralgias. Pertinent negatives include no abdominal pain, chest pain, coughing, neck pain, numbness or weakness. Exacerbated by: moving or lifting. He has tried acetaminophen for the symptoms. The treatment provided no relief.    Past Medical History  Diagnosis Date  . Diabetes mellitus   . Hypertension   . Shoulder injury     Past Surgical History  Procedure Laterality Date  . Cholecystectomy      No family history on file.  History  Substance Use Topics  . Smoking status: Current Every Day Smoker -- 1.00 packs/day    Types: Cigarettes  . Smokeless tobacco: Not on file  . Alcohol Use: No      Review of Systems  Constitutional: Negative for activity change.       All ROS Neg except as noted in HPI  HENT: Negative for nosebleeds and neck pain.   Eyes: Negative for photophobia and discharge.  Respiratory: Negative for cough, shortness of breath and wheezing.   Cardiovascular: Negative for chest pain and palpitations.  Gastrointestinal: Negative for abdominal pain and blood in stool.  Genitourinary: Negative for dysuria, frequency and hematuria.  Musculoskeletal: Positive for arthralgias. Negative for back pain.  Skin: Negative.   Neurological: Negative for dizziness, seizures, speech difficulty, weakness and numbness.  Psychiatric/Behavioral: Negative for hallucinations and confusion.    Allergies  Tramadol;  Aspirin; and Motrin  Home Medications   Current Outpatient Rx  Name  Route  Sig  Dispense  Refill  . acetaminophen (TYLENOL) 500 MG tablet   Oral   Take 1,000 mg by mouth every 6 (six) hours as needed. For pain         . diazepam (VALIUM) 10 MG tablet   Oral   Take 10 mg by mouth 2 (two) times daily.         Marland Kitchen HYDROcodone-acetaminophen (NORCO/VICODIN) 5-325 MG per tablet   Oral   Take 1 tablet by mouth every 4 (four) hours as needed for pain.   20 tablet   0   . lisinopril-hydrochlorothiazide (PRINZIDE,ZESTORETIC) 10-12.5 MG per tablet   Oral   Take 1 tablet by mouth daily.         . metFORMIN (GLUCOPHAGE) 500 MG tablet   Oral   Take 500 mg by mouth 2 (two) times daily.         . ondansetron (ZOFRAN) 4 MG tablet   Oral   Take 4 mg by mouth every 6 (six) hours as needed for nausea.         . predniSONE (DELTASONE) 10 MG tablet      6,5,4,3,2,1 - take with food   21 tablet   0   . sulfamethoxazole-trimethoprim (SEPTRA DS) 800-160 MG per tablet   Oral   Take 1 tablet by mouth 2 (two) times daily. For 14 days   28 tablet   0     BP 157/86  Pulse 88  Temp(Src) 97.5 F (36.4 C) (Oral)  Resp 20  Ht  6\' 4"  (1.93 m)  Wt 330 lb (149.687 kg)  BMI 40.19 kg/m2  SpO2 99%  Physical Exam  Nursing note and vitals reviewed. Constitutional: He is oriented to person, place, and time. He appears well-developed and well-nourished.  Non-toxic appearance.  HENT:  Head: Normocephalic.  Right Ear: Tympanic membrane and external ear normal.  Left Ear: Tympanic membrane and external ear normal.  Eyes: EOM and lids are normal. Pupils are equal, round, and reactive to light.  Neck: Normal range of motion. Neck supple. Carotid bruit is not present.  Cardiovascular: Normal rate, regular rhythm, normal heart sounds, intact distal pulses and normal pulses.   Pulmonary/Chest: Breath sounds normal. No respiratory distress.  Abdominal: Soft. Bowel sounds are normal. There is no  tenderness. There is no guarding.  Musculoskeletal:       Left shoulder: He exhibits decreased range of motion, tenderness, crepitus and pain. He exhibits no effusion and no deformity.  Lymphadenopathy:       Head (right side): No submandibular adenopathy present.       Head (left side): No submandibular adenopathy present.    He has no cervical adenopathy.  Neurological: He is alert and oriented to person, place, and time. He has normal strength. No cranial nerve deficit or sensory deficit.  Skin: Skin is warm and dry.  Psychiatric: He has a normal mood and affect. His speech is normal.    ED Course  Procedures (including critical care time)  Labs Reviewed - No data to display Dg Shoulder Left  02/26/2013   *RADIOLOGY REPORT*  Clinical Data: Lifting injury to the left shoulder.  LEFT SHOULDER - 2+ VIEW  Comparison: Left shoulder x-rays 01/16/2013.  Findings: No evidence of acute fracture or glenohumeral dislocation.  Subacromial space well preserved.  Acromioclavicular joint intact without significant degenerative change.  No intrinsic osseous abnormalities.  No significant interval change.  IMPRESSION: Normal examination.   Original Report Authenticated By: Hulan Saas, M.D.     1. Shoulder pain, left       MDM  I have reviewed nursing notes, vital signs, and all appropriate lab and imaging results for this patient. Xray of the left shoulder is negative for fracture or dislocation. No neurovascular deficit. Pt placed on prednisone and norco. Fitted with a sling. Pt to see orthopedics for evaluation.       Kathie Dike, PA-C 02/27/13 [phone]

## 2013-02-28 ENCOUNTER — Encounter (HOSPITAL_COMMUNITY): Payer: Self-pay | Admitting: *Deleted

## 2013-02-28 ENCOUNTER — Emergency Department (HOSPITAL_COMMUNITY)
Admission: EM | Admit: 2013-02-28 | Discharge: 2013-02-28 | Disposition: A | Discharge status: Home / Self Care | Payer: Medicaid Other | Attending: Emergency Medicine | Admitting: Emergency Medicine

## 2013-02-28 DIAGNOSIS — Z87828 Personal history of other (healed) physical injury and trauma: Secondary | ICD-10-CM | POA: Insufficient documentation

## 2013-02-28 DIAGNOSIS — L02411 Cutaneous abscess of right axilla: Secondary | ICD-10-CM

## 2013-02-28 DIAGNOSIS — F17200 Nicotine dependence, unspecified, uncomplicated: Secondary | ICD-10-CM | POA: Insufficient documentation

## 2013-02-28 DIAGNOSIS — Z79899 Other long term (current) drug therapy: Secondary | ICD-10-CM | POA: Insufficient documentation

## 2013-02-28 DIAGNOSIS — E119 Type 2 diabetes mellitus without complications: Secondary | ICD-10-CM | POA: Insufficient documentation

## 2013-02-28 DIAGNOSIS — IMO0002 Reserved for concepts with insufficient information to code with codable children: Secondary | ICD-10-CM | POA: Insufficient documentation

## 2013-02-28 DIAGNOSIS — I10 Essential (primary) hypertension: Secondary | ICD-10-CM | POA: Insufficient documentation

## 2013-02-28 MED ORDER — FENTANYL CITRATE 0.05 MG/ML IJ SOLN
100.0000 ug | Freq: Once | INTRAMUSCULAR | Status: AC
  Administered 2013-02-28: 100 ug via INTRAMUSCULAR
  Filled 2013-02-28: qty 2

## 2013-02-28 MED ORDER — AMOXICILLIN 500 MG PO CAPS: 500.0000 mg | ORAL_CAPSULE | Freq: Three times a day (TID) | ORAL | Status: DC

## 2013-02-28 MED ORDER — DOXYCYCLINE HYCLATE 100 MG PO CAPS: 100.0000 mg | ORAL_CAPSULE | Freq: Two times a day (BID) | ORAL | Status: AC

## 2013-02-28 MED ORDER — ONDANSETRON HCL 4 MG PO TABS
4.0000 mg | ORAL_TABLET | Freq: Once | ORAL | Status: AC
  Administered 2013-02-28: 4 mg via ORAL
  Filled 2013-02-28: qty 1

## 2013-02-28 MED ORDER — PENICILLIN V POTASSIUM 250 MG PO TABS
500.0000 mg | ORAL_TABLET | Freq: Once | ORAL | Status: AC
  Administered 2013-02-28: 500 mg via ORAL
  Filled 2013-02-28: qty 2

## 2013-02-28 MED ORDER — BUPIVACAINE HCL (PF) 0.25 % IJ SOLN
INTRAMUSCULAR | Status: AC
  Filled 2013-02-28: qty 30

## 2013-02-28 MED ORDER — OXYCODONE-ACETAMINOPHEN 5-325 MG PO TABS: 1.0000 | ORAL_TABLET | Freq: Four times a day (QID) | ORAL | Status: DC | PRN

## 2013-02-28 MED ORDER — BUPIVACAINE HCL (PF) 0.25 % IJ SOLN
20.0000 mL | Freq: Once | INTRAMUSCULAR | Status: AC
  Administered 2013-02-28: 20 mL

## 2013-02-28 MED ORDER — DOXYCYCLINE HYCLATE 100 MG PO TABS
100.0000 mg | ORAL_TABLET | Freq: Once | ORAL | Status: AC
  Administered 2013-02-28: 100 mg via ORAL
  Filled 2013-02-28: qty 1

## 2013-02-28 NOTE — ED Provider Notes (Signed)
History     CSN: [id number]  Arrival date & time 02/28/13  1810   First MD Initiated Contact with Patient 02/28/13 2006      Chief Complaint  Patient presents with  . Abscess    (Consider location/radiation/quality/duration/timing/severity/associated sxs/prior treatment) Patient is a 32 y.o. male presenting with abscess. The history is provided by the patient.  Abscess Location:  Shoulder/arm Shoulder/arm abscess location:  R axilla Abscess quality: painful, redness and warmth   Abscess quality: not draining   Red streaking: no   Duration:  1 day Progression:  Worsening Pain details:    Quality:  Aching and pressure   Severity:  Severe   Timing:  Intermittent   Progression:  Worsening Chronicity:  Recurrent Context: diabetes   Relieved by:  Nothing Worsened by:  Draining/squeezing Ineffective treatments:  None tried Associated symptoms: no fever, no nausea and no vomiting   Risk factors: prior abscess     Past Medical History  Diagnosis Date  . Diabetes mellitus   . Hypertension   . Shoulder injury     Past Surgical History  Procedure Laterality Date  . Cholecystectomy      No family history on file.  History  Substance Use Topics  . Smoking status: Current Every Day Smoker -- 1.00 packs/day    Types: Cigarettes  . Smokeless tobacco: Not on file  . Alcohol Use: No      Review of Systems  Constitutional: Negative for fever and activity change.       All ROS Neg except as noted in HPI  HENT: Negative for nosebleeds and neck pain.   Eyes: Negative for photophobia and discharge.  Respiratory: Negative for cough, shortness of breath and wheezing.   Cardiovascular: Negative for chest pain and palpitations.  Gastrointestinal: Negative for nausea, vomiting, abdominal pain and blood in stool.  Genitourinary: Negative for dysuria, frequency and hematuria.  Musculoskeletal: Positive for arthralgias. Negative for back pain.  Skin: Negative.   Neurological:  Negative for dizziness, seizures and speech difficulty.  Psychiatric/Behavioral: Negative for hallucinations and confusion.    Allergies  Tramadol; Aspirin; and Motrin  Home Medications   Current Outpatient Rx  Name  Route  Sig  Dispense  Refill  . acetaminophen (TYLENOL) 500 MG tablet   Oral   Take 1,000 mg by mouth every 6 (six) hours as needed. For pain         . diazepam (VALIUM) 10 MG tablet   Oral   Take 10 mg by mouth 2 (two) times daily.         Marland Kitchen HYDROcodone-acetaminophen (NORCO/VICODIN) 5-325 MG per tablet   Oral   Take 1 tablet by mouth every 4 (four) hours as needed for pain.   20 tablet   0   . lisinopril-hydrochlorothiazide (PRINZIDE,ZESTORETIC) 10-12.5 MG per tablet   Oral   Take 1 tablet by mouth daily.         . metFORMIN (GLUCOPHAGE) 500 MG tablet   Oral   Take 500 mg by mouth 2 (two) times daily.         . ondansetron (ZOFRAN) 4 MG tablet   Oral   Take 4 mg by mouth every 6 (six) hours as needed for nausea.         . predniSONE (DELTASONE) 10 MG tablet      6,5,4,3,2,1 - take with food   21 tablet   0   . sulfamethoxazole-trimethoprim (SEPTRA DS) 800-160 MG per tablet   Oral  Take 1 tablet by mouth 2 (two) times daily. For 14 days   28 tablet   0     BP 148/85  Pulse 90  Temp(Src) 97.9 F (36.6 C) (Oral)  Resp 20  Ht 6\' 4"  (1.93 m)  Wt 330 lb (149.687 kg)  BMI 40.19 kg/m2  SpO2 99%  Physical Exam  Nursing note and vitals reviewed. Constitutional: He is oriented to person, place, and time. He appears well-developed and well-nourished.  Non-toxic appearance.  HENT:  Head: Normocephalic.  Right Ear: Tympanic membrane and external ear normal.  Left Ear: Tympanic membrane and external ear normal.  Eyes: EOM and lids are normal. Pupils are equal, round, and reactive to light.  Neck: Normal range of motion. Neck supple. Carotid bruit is not present.  Cardiovascular: Normal rate, regular rhythm, normal heart sounds, intact  distal pulses and normal pulses.   Pulmonary/Chest: Breath sounds normal. No respiratory distress.  Abdominal: Soft. Bowel sounds are normal. There is no tenderness. There is no guarding.  Musculoskeletal: Normal range of motion.  ABSCESS OF THE RIGHT AXILLA.No drainage or red streaking. Tender to palpation.  Lymphadenopathy:       Head (right side): No submandibular adenopathy present.       Head (left side): No submandibular adenopathy present.    He has no cervical adenopathy.  Neurological: He is alert and oriented to person, place, and time. He has normal strength. No cranial nerve deficit or sensory deficit.  Skin: Skin is warm and dry.  Psychiatric: He has a normal mood and affect. His speech is normal.    ED Course  Procedures (including critical care time)  Labs Reviewed  GLUCOSE, CAPILLARY - Abnormal; Notable for the following:    Glucose-Capillary 216 (*)    All other components within normal limits  CULTURE, ROUTINE-ABSCESS   No results found.   No diagnosis found.    MDM  I have reviewed nursing notes, vital signs, and all appropriate lab and imaging results for this patient. The abscess of the right axilla is not a candidate at this time for I and D. Vital signs stable. Pt treated with doxycycline, percocet and amoxil 500mg . Pt to use warm tub soaks daily. She is to see surg. Consultant if not improving.       Kathie Dike, PA-C 03/07/13 [phone]

## 2013-02-28 NOTE — ED Notes (Addendum)
Pt c/o abscess area to right axilla area that he noticed this am, denies any drainage, bsl 216 at triage.

## 2013-02-28 NOTE — ED Notes (Signed)
Pt alert & oriented x4, stable gait. Patient given discharge instructions, paperwork & prescription(s). Patient  instructed to stop at the registration desk to finish any additional paperwork. Patient verbalized understanding. Pt left department w/ no further questions. 

## 2013-03-02 NOTE — ED Provider Notes (Signed)
Medical screening examination/treatment/procedure(s) were performed by non-physician practitioner and as supervising physician I was immediately available for consultation/collaboration.  Donnetta Hutching, MD 03/02/13 [phone]

## 2013-03-05 LAB — CULTURE, ROUTINE-ABSCESS: Gram Stain: NONE SEEN

## 2013-03-06 NOTE — ED Notes (Signed)
Post ED Visit - Positive Culture Follow-up  Culture report reviewed by antimicrobial stewardship pharmacist: []  Wes Dulaney, Pharm.D., BCPS [x]  Celedonio Miyamoto, [address].D., BCPS []  Georgina Pillion, Pharm.D., BCPS []  [place], [address].D., BCPS, AAHIVP []  Estella Husk, Pharm.D., BCPS, AAHIV  Positive routine ABSCESS CULTUREculture Treated with AMOXICILLIN AND DOXYCYLINE, organism sensitive to the same and no further patient follow-up is required at this time.  [place], [place] 03/06/2013, 3:37 PM

## 2013-03-07 NOTE — ED Provider Notes (Signed)
Medical screening examination/treatment/procedure(s) were performed by non-physician practitioner and as supervising physician I was immediately available for consultation/collaboration. Devoria Albe, MD, [facility]   Ward Givens, MD 03/07/13 [phone]

## 2013-03-29 ENCOUNTER — Encounter (HOSPITAL_COMMUNITY): Payer: Self-pay | Admitting: *Deleted

## 2013-03-29 ENCOUNTER — Emergency Department (HOSPITAL_COMMUNITY): Payer: Medicaid Other

## 2013-03-29 ENCOUNTER — Emergency Department (HOSPITAL_COMMUNITY)
Admission: EM | Admit: 2013-03-29 | Discharge: 2013-03-29 | Disposition: A | Discharge status: Home / Self Care | Payer: Medicaid Other | Attending: Emergency Medicine | Admitting: Emergency Medicine

## 2013-03-29 DIAGNOSIS — X58XXXA Exposure to other specified factors, initial encounter: Secondary | ICD-10-CM | POA: Insufficient documentation

## 2013-03-29 DIAGNOSIS — F17200 Nicotine dependence, unspecified, uncomplicated: Secondary | ICD-10-CM | POA: Insufficient documentation

## 2013-03-29 DIAGNOSIS — Z79899 Other long term (current) drug therapy: Secondary | ICD-10-CM | POA: Insufficient documentation

## 2013-03-29 DIAGNOSIS — M549 Dorsalgia, unspecified: Secondary | ICD-10-CM | POA: Insufficient documentation

## 2013-03-29 DIAGNOSIS — Y929 Unspecified place or not applicable: Secondary | ICD-10-CM | POA: Insufficient documentation

## 2013-03-29 DIAGNOSIS — S161XXA Strain of muscle, fascia and tendon at neck level, initial encounter: Secondary | ICD-10-CM

## 2013-03-29 DIAGNOSIS — S139XXA Sprain of joints and ligaments of unspecified parts of neck, initial encounter: Secondary | ICD-10-CM | POA: Insufficient documentation

## 2013-03-29 DIAGNOSIS — E119 Type 2 diabetes mellitus without complications: Secondary | ICD-10-CM | POA: Insufficient documentation

## 2013-03-29 DIAGNOSIS — Y939 Activity, unspecified: Secondary | ICD-10-CM | POA: Insufficient documentation

## 2013-03-29 DIAGNOSIS — Z87828 Personal history of other (healed) physical injury and trauma: Secondary | ICD-10-CM | POA: Insufficient documentation

## 2013-03-29 DIAGNOSIS — I10 Essential (primary) hypertension: Secondary | ICD-10-CM | POA: Insufficient documentation

## 2013-03-29 MED ORDER — PREDNISONE 50 MG PO TABS
60.0000 mg | ORAL_TABLET | Freq: Once | ORAL | Status: AC
  Administered 2013-03-29: 60 mg via ORAL
  Filled 2013-03-29: qty 1

## 2013-03-29 MED ORDER — CYCLOBENZAPRINE HCL 10 MG PO TABS
10.0000 mg | ORAL_TABLET | Freq: Once | ORAL | Status: AC
  Administered 2013-03-29: 10 mg via ORAL
  Filled 2013-03-29: qty 1

## 2013-03-29 MED ORDER — ONDANSETRON 8 MG PO TBDP
8.0000 mg | ORAL_TABLET | Freq: Once | ORAL | Status: AC
  Administered 2013-03-29: 8 mg via ORAL
  Filled 2013-03-29: qty 1

## 2013-03-29 MED ORDER — MORPHINE SULFATE 4 MG/ML IJ SOLN
8.0000 mg | Freq: Once | INTRAMUSCULAR | Status: AC
  Administered 2013-03-29: 8 mg via INTRAMUSCULAR
  Filled 2013-03-29: qty 2

## 2013-03-29 MED ORDER — CYCLOBENZAPRINE HCL 10 MG PO TABS: 10.0000 mg | ORAL_TABLET | Freq: Two times a day (BID) | ORAL | Status: DC | PRN

## 2013-03-29 MED ORDER — OXYCODONE-ACETAMINOPHEN 5-325 MG PO TABS: 2.0000 | ORAL_TABLET | ORAL | Status: DC | PRN

## 2013-03-29 MED ORDER — PREDNISONE 20 MG PO TABS: ORAL_TABLET | ORAL | Status: DC

## 2013-03-29 MED ORDER — OXYCODONE-ACETAMINOPHEN 5-325 MG PO TABS
2.0000 | ORAL_TABLET | Freq: Once | ORAL | Status: AC
  Administered 2013-03-29: 2 via ORAL
  Filled 2013-03-29: qty 2

## 2013-03-29 NOTE — ED Provider Notes (Signed)
History     CSN: [id number]  Arrival date & time 03/29/13  2206   First MD Initiated Contact with Patient 03/29/13 2235      Chief Complaint  Patient presents with  . Neck Pain  . Back Pain    (Consider location/radiation/quality/duration/timing/severity/associated sxs/prior treatment) HPI.... Sharp right sided neck pain after vigorous movement a couple days ago. Pain radiates down to his mid back. No radicular symptoms. Positioning and palpation makes pain worse. Severity is moderate. No meningeal signs.  Past Medical History  Diagnosis Date  . Diabetes mellitus   . Hypertension   . Shoulder injury     Past Surgical History  Procedure Laterality Date  . Cholecystectomy      History reviewed. No pertinent family history.  History  Substance Use Topics  . Smoking status: Current Every Day Smoker -- 1.00 packs/day    Types: Cigarettes  . Smokeless tobacco: Not on file  . Alcohol Use: No      Review of Systems  All other systems reviewed and are negative.    Allergies  Tramadol; Aspirin; and Motrin  Home Medications   Current Outpatient Rx  Name  Route  Sig  Dispense  Refill  . amLODipine (NORVASC) 5 MG tablet   Oral   Take 5 mg by mouth daily.         . diazepam (VALIUM) 10 MG tablet   Oral   Take 10 mg by mouth 2 (two) times daily.         Marland Kitchen lisinopril-hydrochlorothiazide (PRINZIDE,ZESTORETIC) 10-12.5 MG per tablet   Oral   Take 1 tablet by mouth daily.         . metFORMIN (GLUCOPHAGE) 500 MG tablet   Oral   Take 500 mg by mouth 2 (two) times daily.           BP 152/92  Pulse 82  Temp(Src) 98.8 F (37.1 C) (Oral)  Resp 20  Ht 6' 4.5" (1.943 m)  Wt 310 lb (140.615 kg)  BMI 37.25 kg/m2  SpO2 98%  Physical Exam  Nursing note and vitals reviewed. Constitutional: He is oriented to person, place, and time. He appears well-developed and well-nourished.  HENT:  Head: Normocephalic and atraumatic.  Eyes: Conjunctivae and EOM are  normal. Pupils are equal, round, and reactive to light.  Neck:  Tender right lateral cervical spine.  Pain with range of motion.  Cardiovascular: Normal rate, regular rhythm and normal heart sounds.   Pulmonary/Chest: Effort normal and breath sounds normal.  Abdominal: Soft. Bowel sounds are normal.  Musculoskeletal: Normal range of motion.  Neurological: He is alert and oriented to person, place, and time.  Skin: Skin is warm and dry.  Psychiatric: He has a normal mood and affect.    ED Course  Procedures (including critical care time)  Labs Reviewed - No data to display No results found.   No diagnosis found.  Ct Cervical Spine Wo Contrast  03/29/2013   *RADIOLOGY REPORT*  Clinical Data: Neck pain  CT CERVICAL SPINE WITHOUT CONTRAST  Technique:  Multidetector CT imaging of the cervical spine was performed. Multiplanar CT image reconstructions were also generated.  Comparison: 12/30/2012, CT 07/01/2012  Findings: .  Normal alignment.  No fracture or mass lesion.  No significant degenerative change.  Disc spaces are maintained and there is no significant spurring.  IMPRESSION: Negative   Original Report Authenticated By: Janeece Riggers, M.D.    MDM  CT cervical spine reveals no acute anomalies.  Discharge meds prednisone for 5 days, Flexeril 10 mg #20        Donnetta Hutching, MD 03/29/13 2334

## 2013-03-29 NOTE — ED Notes (Signed)
Pt c/o left sided neck pain that radiates down right side of back x 3 days.

## 2013-04-02 ENCOUNTER — Emergency Department (HOSPITAL_COMMUNITY)
Admission: EM | Admit: 2013-04-02 | Discharge: 2013-04-02 | Disposition: A | Discharge status: Home / Self Care | Payer: Medicaid Other | Attending: Emergency Medicine | Admitting: Emergency Medicine

## 2013-04-02 ENCOUNTER — Encounter (HOSPITAL_COMMUNITY): Payer: Self-pay | Admitting: *Deleted

## 2013-04-02 DIAGNOSIS — M436 Torticollis: Secondary | ICD-10-CM | POA: Insufficient documentation

## 2013-04-02 DIAGNOSIS — Z87828 Personal history of other (healed) physical injury and trauma: Secondary | ICD-10-CM | POA: Insufficient documentation

## 2013-04-02 DIAGNOSIS — E119 Type 2 diabetes mellitus without complications: Secondary | ICD-10-CM | POA: Insufficient documentation

## 2013-04-02 DIAGNOSIS — E669 Obesity, unspecified: Secondary | ICD-10-CM | POA: Insufficient documentation

## 2013-04-02 DIAGNOSIS — M62838 Other muscle spasm: Secondary | ICD-10-CM | POA: Insufficient documentation

## 2013-04-02 DIAGNOSIS — I10 Essential (primary) hypertension: Secondary | ICD-10-CM | POA: Insufficient documentation

## 2013-04-02 DIAGNOSIS — Z765 Malingerer [conscious simulation]: Secondary | ICD-10-CM | POA: Insufficient documentation

## 2013-04-02 DIAGNOSIS — M549 Dorsalgia, unspecified: Secondary | ICD-10-CM | POA: Insufficient documentation

## 2013-04-02 DIAGNOSIS — Z79899 Other long term (current) drug therapy: Secondary | ICD-10-CM | POA: Insufficient documentation

## 2013-04-02 DIAGNOSIS — F17200 Nicotine dependence, unspecified, uncomplicated: Secondary | ICD-10-CM | POA: Insufficient documentation

## 2013-04-02 MED ORDER — DIPHENHYDRAMINE HCL 50 MG/ML IJ SOLN
50.0000 mg | Freq: Once | INTRAMUSCULAR | Status: AC
  Administered 2013-04-02: 50 mg via INTRAVENOUS
  Filled 2013-04-02: qty 1

## 2013-04-02 MED ORDER — DIAZEPAM 5 MG/ML IJ SOLN
10.0000 mg | Freq: Once | INTRAMUSCULAR | Status: AC
  Administered 2013-04-02: 10 mg via INTRAVENOUS
  Filled 2013-04-02: qty 2

## 2013-04-02 MED ORDER — METHOCARBAMOL 750 MG PO TABS: 1500.0000 mg | ORAL_TABLET | Freq: Three times a day (TID) | ORAL | Status: DC | PRN

## 2013-04-02 MED ORDER — DIAZEPAM 5 MG PO TABS: 5.0000 mg | ORAL_TABLET | Freq: Two times a day (BID) | ORAL | Status: DC | PRN

## 2013-04-02 MED ORDER — DIAZEPAM 2 MG PO TABS
2.0000 mg | ORAL_TABLET | Freq: Once | ORAL | Status: AC
  Administered 2013-04-02: 2 mg via ORAL
  Filled 2013-04-02: qty 1

## 2013-04-02 MED ORDER — SODIUM CHLORIDE 0.9 % IV SOLN
INTRAVENOUS | Status: DC
  Administered 2013-04-02: 21:00:00 via INTRAVENOUS

## 2013-04-02 MED ORDER — METOCLOPRAMIDE HCL 5 MG/ML IJ SOLN
10.0000 mg | Freq: Once | INTRAMUSCULAR | Status: AC
  Administered 2013-04-02: 10 mg via INTRAVENOUS
  Filled 2013-04-02: qty 2

## 2013-04-02 MED ORDER — DEXAMETHASONE SODIUM PHOSPHATE 4 MG/ML IJ SOLN
10.0000 mg | Freq: Once | INTRAMUSCULAR | Status: AC
  Administered 2013-04-02: 10 mg via INTRAVENOUS
  Filled 2013-04-02: qty 3

## 2013-04-02 NOTE — ED Notes (Signed)
Pt co neck stiffness for 6 days, pain goes into back, denies injury.

## 2013-04-02 NOTE — ED Notes (Signed)
MD at bedside. 

## 2013-04-02 NOTE — ED Notes (Signed)
Pt seen here on Sunday evening for same complaint. States that he is now experiencing back pain in addition to the neck pain after lifting his son. Also states he is now experiencing sharp pain when turning his head to the left but is able to turn to the right without complaint.

## 2013-04-02 NOTE — ED Provider Notes (Signed)
History    This chart was scribed for Ward Givens, MD by Donne Anon, ED Scribe. This patient was seen in room APA07/AP[facility]07 and the patient's care was started at 2020.  CSN: [id number] Arrival date & time 04/02/13  4098  First MD Initiated Contact with Patient 04/02/13 2020     Chief Complaint  Patient presents with  . Neck Pain    6 days ago, neck started hurting  . Back Pain    The history is provided by the patient. No language interpreter was used.   HPI Comments: Khallid Pasillas is a 32 y.o. male who presents to the Emergency Department complaining of 6 days of sudden onset, gradually worsening neck stiffness that radiates into his back. He was seen in the ED 4 days ago for the same complaint. He states every morning when he wakes up he pops his neck. 7 days ago when he popped his neck he experienced pain that went away right afterwards. When he woke up the day after that he began experiencing neck stiffness that has persisted since. He denies any recent trauma or injury. He reports yesterday he began experiencing shooting, stabbing back pain that began when he picked up his 32 lb son. He reports he began experiencing tingling in left hand that began today. He denies difficulty using his hands, difficulty walking, numbness or tingling with the exception of his left hand, or any other pain. He reports that the muscle relaxant he was prescribed in the ED 4 days ago caused his blood sugar to spike to 400 so he discontinued taking it. When asked, he denies taking pain medication for his chronic shoulder pain.   Dr. Arlyce Dice is his PCP.  Dr. Vickki Muff of [facility] is his orthopedist.  Past Medical History  Diagnosis Date  . Diabetes mellitus   . Hypertension   . Shoulder injury    Past Surgical History  Procedure Laterality Date  . Cholecystectomy     History reviewed. No pertinent family history. History  Substance Use Topics  . Smoking status: Current Every Day Smoker -- 1.00 packs/day     Types: Cigarettes  . Smokeless tobacco: Not on file  . Alcohol Use: No  lives at home Lives with spouse On disability for his shoulder, waiting to see an orthopedist about surgery  Review of Systems  HENT: Positive for neck stiffness.   All other systems reviewed and are negative.    Allergies  Tramadol; Aspirin; and Motrin  Home Medications   Current Outpatient Rx  Name  Route  Sig  Dispense  Refill  . amLODipine (NORVASC) 5 MG tablet   Oral   Take 5 mg by mouth daily.         . cyclobenzaprine (FLEXERIL) 10 MG tablet   Oral   Take 1 tablet (10 mg total) by mouth 2 (two) times daily as needed for muscle spasms.   20 tablet   0   . diazepam (VALIUM) 10 MG tablet   Oral   Take 10 mg by mouth 2 (two) times daily.         Marland Kitchen lisinopril-hydrochlorothiazide (PRINZIDE,ZESTORETIC) 10-12.5 MG per tablet   Oral   Take 1 tablet by mouth daily.         . metFORMIN (GLUCOPHAGE) 500 MG tablet   Oral   Take 500 mg by mouth 2 (two) times daily.         Marland Kitchen oxyCODONE-acetaminophen (PERCOCET) 5-325 MG per tablet   Oral  Take 2 tablets by mouth every 4 (four) hours as needed for pain.   20 tablet   0   . predniSONE (DELTASONE) 20 MG tablet      3 tabs po day one, then 2 po daily x 4 days   11 tablet   0    BP 158/96  Pulse 89  Temp(Src) 98 F (36.7 C) (Oral)  Resp 16  SpO2 100%  Vital signs normal    Physical Exam  Nursing note and vitals reviewed. Constitutional: He is oriented to person, place, and time. He appears well-developed and well-nourished.  Non-toxic appearance. He does not appear ill. No distress.  obese  HENT:  Head: Normocephalic and atraumatic.  Right Ear: External ear normal.  Left Ear: External ear normal.  Nose: Nose normal. No mucosal edema or rhinorrhea.  Mouth/Throat: Oropharynx is clear and moist and mucous membranes are normal. No dental abscesses or edematous.  Eyes: Conjunctivae and EOM are normal. Pupils are equal, round,  and reactive to light.  Neck: Normal range of motion and full passive range of motion without pain. Neck supple.  Cardiovascular: Normal rate, regular rhythm and normal heart sounds.  Exam reveals no gallop and no friction rub.   No murmur heard. Pulmonary/Chest: Effort normal and breath sounds normal. No respiratory distress. He has no wheezes. He has no rhonchi. He has no rales. He exhibits no tenderness and no crepitus.  Abdominal: Soft. Normal appearance and bowel sounds are normal. He exhibits no distension. There is no tenderness. There is no rebound and no guarding.  Musculoskeletal: Normal range of motion. He exhibits tenderness. He exhibits no edema.       Back:  Moves all extremities well. Tender over left trapezius. Equal and strong grip strength. Non tender cervical midline spine. Right trapezius muscle non tender. No motor deficit.   Neurological: He is alert and oriented to person, place, and time. He has normal strength. No cranial nerve deficit.  Skin: Skin is warm, dry and intact. No rash noted. No erythema. No pallor.  Psychiatric: He has a normal mood and affect. His speech is normal and behavior is normal. His mood appears not anxious.    ED Course  Procedures (including critical care time)  Medications  0.9 %  sodium chloride infusion ( Intravenous Stopped 04/02/13 2222)  diazepam (VALIUM) injection 10 mg (10 mg Intravenous Given 04/02/13 2116)  dexamethasone (DECADRON) injection 10 mg (10 mg Intravenous Given 04/02/13 2116)  metoCLOPramide (REGLAN) injection 10 mg (10 mg Intravenous Given 04/02/13 2116)  diphenhydrAMINE (BENADRYL) injection 50 mg (50 mg Intravenous Given 04/02/13 2116)  diazepam (VALIUM) tablet 2 mg (2 mg Oral Given 04/02/13 2221)    DIAGNOSTIC STUDIES: Oxygen Saturation is 100% on RA, normal by my interpretation.    COORDINATION OF CARE: 8:48 PM Discussed treatment plan which includes IV fluids and muscle relaxers with pt at bedside and pt agreed to  plan.   10:02 PM Rechecked pt, who reports mild improvement. He states he ran out of medication on 03/17/13. He is requesting Valium at this time.   Review of the [place] controlled substance site shows patient has been getting #120 hydrocodone 7.5/325 on a monthly basis from Dr. Arlyce Dice. It is usually filled around the second of the month. In May he received #90 hydrocodone 7.5/325 from Dr. Arlyce Dice and #20 hydrocodone 5/325 from our ER and #40 oxycodone 5/325 in 2 prescriptions from our fast track, in June he received #30 hydrocodone 7.5 /325 on  June 2, #90 hydrocodone 7.5/325 on June 10 both from Dr. Arlyce Dice and #20 oxycodone 5/325 from our emergency department. He has also been getting # 60 Valium 10 on her regular basis again around the first of the month the last time was June 2. mg tablets, when asked about this patient and Fleet Contras he stated he ran out on June 10 and then he changed his story several times.  This is this patient's 13th ED visit in the past 6 months with 2 visits in March, 3 in April, 4 in May, and 2 mg mainly for his shoulder, and abscesses, and neck pain.  Patient was advised he would need to follow up with his primary care physician Dr. Arlyce Dice who has been managing his chronic pain for further pain medication. It is not clear when he ran out of his medicine, he now states he has run out of his Valium also.    1. Trapezius muscle spasm   2. Drug-seeking behavior       Discharge Medication List as of 04/02/2013 10:18 PM    START taking these medications   Details  !! diazepam (VALIUM) 5 MG tablet Take 1 tablet (5 mg total) by mouth every 12 (twelve) hours as needed for anxiety., Starting 04/02/2013, Until Discontinued, Print    methocarbamol (ROBAXIN) 750 MG tablet Take 2 tablets (1,500 mg total) by mouth 3 (three) times daily as needed., Starting 04/02/2013, Until Discontinued, Print     !! - Potential duplicate medications found. Please discuss with provider.       Plan discharge   Devoria Albe, MD, FACEP   MDM   I personally performed the services described in this documentation, which was scribed in my presence. The recorded information has been reviewed and considered.  Devoria Albe, MD, Arm[facility]    Ward Givens, MD 04/02/13 [phone]

## 2013-06-24 ENCOUNTER — Encounter (HOSPITAL_COMMUNITY): Payer: Self-pay | Admitting: *Deleted

## 2013-06-24 ENCOUNTER — Emergency Department (HOSPITAL_COMMUNITY)
Admission: EM | Admit: 2013-06-24 | Discharge: 2013-06-24 | Disposition: A | Discharge status: Home / Self Care | Payer: Medicaid Other | Attending: Emergency Medicine | Admitting: Emergency Medicine

## 2013-06-24 DIAGNOSIS — M25519 Pain in unspecified shoulder: Secondary | ICD-10-CM | POA: Insufficient documentation

## 2013-06-24 DIAGNOSIS — F17200 Nicotine dependence, unspecified, uncomplicated: Secondary | ICD-10-CM | POA: Insufficient documentation

## 2013-06-24 DIAGNOSIS — I10 Essential (primary) hypertension: Secondary | ICD-10-CM | POA: Insufficient documentation

## 2013-06-24 DIAGNOSIS — Z79899 Other long term (current) drug therapy: Secondary | ICD-10-CM | POA: Insufficient documentation

## 2013-06-24 DIAGNOSIS — G8929 Other chronic pain: Secondary | ICD-10-CM

## 2013-06-24 DIAGNOSIS — E119 Type 2 diabetes mellitus without complications: Secondary | ICD-10-CM | POA: Insufficient documentation

## 2013-06-24 DIAGNOSIS — Z9889 Other specified postprocedural states: Secondary | ICD-10-CM | POA: Insufficient documentation

## 2013-06-24 MED ORDER — HYDROCODONE-ACETAMINOPHEN 5-325 MG PO TABS
2.0000 | ORAL_TABLET | Freq: Once | ORAL | Status: AC
  Administered 2013-06-24: 2 via ORAL
  Filled 2013-06-24: qty 2

## 2013-06-24 MED ORDER — DIAZEPAM 5 MG PO TABS: 5.0000 mg | ORAL_TABLET | Freq: Two times a day (BID) | ORAL | Status: DC

## 2013-06-24 NOTE — ED Notes (Signed)
Pt had physical therapy yesterday and states pain started after therapy

## 2013-06-26 NOTE — ED Provider Notes (Signed)
CSN: [id number]     Arrival date & time 06/24/13  1850 History   First MD Initiated Contact with Patient 06/24/13 1904     Chief Complaint  Patient presents with  . Shoulder Pain   (Consider location/radiation/quality/duration/timing/severity/associated sxs/prior Treatment) HPI Comments: Miguel Weber is a 32 y.o. Male presenting with chronic left shoulder pain.  He has what is suspected to be a left rotator injury,  Followed by an orthopedist in [facility] and underwent his first PT session yesterday.  During the maneuvers, he reports having increased pain and development of crepitus.  The physical therapist told him he doesn't need therapy, he needs surgery.  Patient has not spoken to his orthopedist about this,  But was told the PT would contact him.  In the interim,  He has increased pain in the shoulder which is constant, aching, and sharp with movement.  He has taken tylenol without relief of pain.  He feels muscle spasm across his left upper back as well.  He has no radiation of pain into the arm and denies weakness or numbness distally.       The history is provided by the patient.    Past Medical History  Diagnosis Date  . Diabetes mellitus   . Hypertension   . Shoulder injury    Past Surgical History  Procedure Laterality Date  . Cholecystectomy     History reviewed. No pertinent family history. History  Substance Use Topics  . Smoking status: Current Every Day Smoker -- 1.00 packs/day    Types: Cigarettes  . Smokeless tobacco: Not on file  . Alcohol Use: No    Review of Systems  Constitutional: Negative for fever.  Musculoskeletal: Positive for arthralgias. Negative for myalgias and joint swelling.  Neurological: Negative for weakness and numbness.    Allergies  Tramadol; Aspirin; and Motrin  Home Medications   Current Outpatient Rx  Name  Route  Sig  Dispense  Refill  . amLODipine (NORVASC) 5 MG tablet   Oral   Take 5 mg by mouth daily.         .  diazepam (VALIUM) 10 MG tablet   Oral   Take 10 mg by mouth 2 (two) times daily.         . diazepam (VALIUM) 5 MG tablet   Oral   Take 1 tablet (5 mg total) by mouth every 12 (twelve) hours as needed for anxiety.   4 tablet   0   . diazepam (VALIUM) 5 MG tablet   Oral   Take 1 tablet (5 mg total) by mouth 2 (two) times daily.   10 tablet   0   . lisinopril-hydrochlorothiazide (PRINZIDE,ZESTORETIC) 10-12.5 MG per tablet   Oral   Take 1 tablet by mouth daily.         . metFORMIN (GLUCOPHAGE) 500 MG tablet   Oral   Take 500 mg by mouth 2 (two) times daily.         . methocarbamol (ROBAXIN) 750 MG tablet   Oral   Take 2 tablets (1,500 mg total) by mouth 3 (three) times daily as needed.   60 tablet   0    BP 142/89  Pulse 83  Temp(Src) 98.3 F (36.8 C) (Oral)  Resp 20  Ht 6\' 4"  (1.93 m)  Wt 300 lb (136.079 kg)  BMI 36.53 kg/m2  SpO2 97% Physical Exam  Constitutional: He appears well-developed and well-nourished.  HENT:  Head: Atraumatic.  Neck: Normal range of  motion.  Cardiovascular:  Pulses equal bilaterally  Musculoskeletal: He exhibits tenderness.       Left shoulder: He exhibits tenderness and spasm. He exhibits no swelling, no effusion, no crepitus, normal pulse and normal strength.  Equal grip strength.  ttp superior and posterior left humeral head. Left trapezius spasm.  Neurological: He is alert. He has normal strength. He displays normal reflexes. No sensory deficit.  Equal strength  Skin: Skin is warm and dry.  Psychiatric: He has a normal mood and affect.    ED Course  Procedures (including critical care time) Labs Review Labs Reviewed - No data to display Imaging Review No results found.  MDM   1. Chronic left shoulder pain    Pt with frequent ed visits for this chronic shoulder pain.  He does have spasm on exam,  Gave a short course of valium ,  Encouraged heating pad,  F/u with orthopedist for further management of pain and injury.   He was given a sling to help relieve spasm,  Advised to maintain ROM.  Pt understands.  He asked for narcotics.  This was denied, but was given hydrocodone x 1 here,  Wife driving home.  He understands that chronic pain should be treated by his providers, not the ed.    Burgess Amor, PA-C 06/26/13 1307

## 2013-06-27 NOTE — ED Provider Notes (Signed)
Medical screening examination/treatment/procedure(s) were performed by non-physician practitioner and as supervising physician I was immediately available for consultation/collaboration.   Audree Camel, MD 06/27/13 1259

## 2013-11-10 ENCOUNTER — Emergency Department (HOSPITAL_COMMUNITY)
Admission: EM | Admit: 2013-11-10 | Discharge: 2013-11-10 | Disposition: A | Discharge status: Home / Self Care | Payer: Medicaid Other | Attending: Emergency Medicine | Admitting: Emergency Medicine

## 2013-11-10 ENCOUNTER — Encounter (HOSPITAL_COMMUNITY): Payer: Self-pay | Admitting: Emergency Medicine

## 2013-11-10 DIAGNOSIS — Y9302 Activity, running: Secondary | ICD-10-CM | POA: Insufficient documentation

## 2013-11-10 DIAGNOSIS — I10 Essential (primary) hypertension: Secondary | ICD-10-CM | POA: Insufficient documentation

## 2013-11-10 DIAGNOSIS — S76219A Strain of adductor muscle, fascia and tendon of unspecified thigh, initial encounter: Secondary | ICD-10-CM

## 2013-11-10 DIAGNOSIS — Y929 Unspecified place or not applicable: Secondary | ICD-10-CM | POA: Insufficient documentation

## 2013-11-10 DIAGNOSIS — Z79899 Other long term (current) drug therapy: Secondary | ICD-10-CM | POA: Insufficient documentation

## 2013-11-10 DIAGNOSIS — F17200 Nicotine dependence, unspecified, uncomplicated: Secondary | ICD-10-CM | POA: Insufficient documentation

## 2013-11-10 DIAGNOSIS — E119 Type 2 diabetes mellitus without complications: Secondary | ICD-10-CM | POA: Insufficient documentation

## 2013-11-10 DIAGNOSIS — X500XXA Overexertion from strenuous movement or load, initial encounter: Secondary | ICD-10-CM | POA: Insufficient documentation

## 2013-11-10 DIAGNOSIS — IMO0002 Reserved for concepts with insufficient information to code with codable children: Secondary | ICD-10-CM | POA: Insufficient documentation

## 2013-11-10 MED ORDER — HYDROMORPHONE HCL PF 2 MG/ML IJ SOLN
2.0000 mg | Freq: Once | INTRAMUSCULAR | Status: AC
  Administered 2013-11-10: 2 mg via INTRAVENOUS
  Filled 2013-11-10: qty 1

## 2013-11-10 MED ORDER — ONDANSETRON HCL 4 MG PO TABS
4.0000 mg | ORAL_TABLET | Freq: Once | ORAL | Status: AC
Start: 2013-11-10 — End: 2013-11-10
  Administered 2013-11-10: 4 mg via ORAL
  Filled 2013-11-10: qty 1

## 2013-11-10 MED ORDER — METHOCARBAMOL 500 MG PO TABS
1000.0000 mg | ORAL_TABLET | Freq: Once | ORAL | Status: AC
  Administered 2013-11-10: 1000 mg via ORAL
  Filled 2013-11-10: qty 2

## 2013-11-10 MED ORDER — OXYCODONE-ACETAMINOPHEN 7.5-325 MG PO TABS: 1.0000 | ORAL_TABLET | Freq: Four times a day (QID) | ORAL | Status: DC | PRN

## 2013-11-10 MED ORDER — DIAZEPAM 5 MG PO TABS: 5.0000 mg | ORAL_TABLET | Freq: Four times a day (QID) | ORAL | Status: DC | PRN

## 2013-11-10 NOTE — ED Notes (Addendum)
Patient c/o right hip pain x3 weeks. Patient denies any known injury. Per patient went to chase child running in yard when pain started. Patient states that it hurts to sit, stand, or lay. Patient reports using heating pad, taking tylenol, valium, and percocet with no relief.

## 2013-11-10 NOTE — ED Provider Notes (Signed)
CSN: [id number]     Arrival date & time 11/10/13  1717 History   First MD Initiated Contact with Patient 11/10/13 1852     Chief Complaint  Patient presents with  . Hip Pain   (Consider location/radiation/quality/duration/timing/severity/associated sxs/prior Treatment) HPI Comments: Patient states he was chasing after a young child, when he had to make a sudden stop and injured the right hip and back area. The patient states he has been using Valium and Percocet, even doubling his dose at the suggestion of his primary physician, but continues to have pain. He is able to walk on the area, but it is uncomfortable. He is not on any blood thinning type medications. He denies any previous operations or procedures in the area.  Patient is a 33 y.o. male presenting with hip pain. The history is provided by the patient.  Hip Pain This is a recurrent problem. The current episode started 1 to 4 weeks ago. The problem occurs constantly. The problem has been gradually worsening. Associated symptoms include arthralgias. Pertinent negatives include no abdominal pain, chest pain, coughing, fever, neck pain or numbness. Nothing aggravates the symptoms. He has tried acetaminophen and oral narcotics for the symptoms. The treatment provided no relief.    Past Medical History  Diagnosis Date  . Diabetes mellitus   . Hypertension   . Shoulder injury    Past Surgical History  Procedure Laterality Date  . Cholecystectomy     History reviewed. No pertinent family history. History  Substance Use Topics  . Smoking status: Current Every Day Smoker -- 1.00 packs/day for 22 years    Types: Cigarettes  . Smokeless tobacco: Never Used  . Alcohol Use: No    Review of Systems  Constitutional: Negative for fever and activity change.       All ROS Neg except as noted in HPI  HENT: Negative for nosebleeds.   Eyes: Negative for photophobia and discharge.  Respiratory: Negative for cough, shortness of breath and  wheezing.   Cardiovascular: Negative for chest pain and palpitations.  Gastrointestinal: Negative for abdominal pain and blood in stool.  Genitourinary: Negative for dysuria, frequency and hematuria.  Musculoskeletal: Positive for arthralgias. Negative for back pain and neck pain.  Skin: Negative.   Neurological: Negative for dizziness, seizures, speech difficulty and numbness.  Psychiatric/Behavioral: Negative for hallucinations and confusion.    Allergies  Tramadol; Aspirin; and Motrin  Home Medications   Current Outpatient Rx  Name  Route  Sig  Dispense  Refill  . amLODipine (NORVASC) 5 MG tablet   Oral   Take 5 mg by mouth daily.         . diazepam (VALIUM) 10 MG tablet   Oral   Take 10 mg by mouth 3 (three) times daily.          Marland Kitchen. lisinopril-hydrochlorothiazide (PRINZIDE,ZESTORETIC) 20-12.5 MG per tablet   Oral   Take 1 tablet by mouth daily.         . metFORMIN (GLUCOPHAGE) 500 MG tablet   Oral   Take 500 mg by mouth 2 (two) times daily.         Marland Kitchen. oxyCODONE-acetaminophen (PERCOCET) 7.5-325 MG per tablet   Oral   Take 1 tablet by mouth every 6 (six) hours as needed for pain.          BP 140/87  Pulse 87  Temp(Src) 97.9 F (36.6 C) (Oral)  Resp 16  Ht 6' 3.5" (1.918 m)  Wt 300 lb (136.079 kg)  BMI 36.99 kg/m2  SpO2 95% Physical Exam  Nursing note and vitals reviewed. Constitutional: He is oriented to person, place, and time. He appears well-developed and well-nourished.  Non-toxic appearance.  HENT:  Head: Normocephalic.  Right Ear: Tympanic membrane and external ear normal.  Left Ear: Tympanic membrane and external ear normal.  Eyes: EOM and lids are normal. Pupils are equal, round, and reactive to light.  Neck: Normal range of motion. Neck supple. Carotid bruit is not present.  Cardiovascular: Normal rate, regular rhythm, normal heart sounds, intact distal pulses and normal pulses.   Pulmonary/Chest: Breath sounds normal. No respiratory  distress.  Abdominal: Soft. Bowel sounds are normal. There is no tenderness. There is no guarding.  Musculoskeletal: Normal range of motion.       Legs: Pain in the right inguinal area. Palpable spasm noted. No evidence for dislocation.  Lymphadenopathy:       Head (right side): No submandibular adenopathy present.       Head (left side): No submandibular adenopathy present.    He has no cervical adenopathy.  Neurological: He is alert and oriented to person, place, and time. He has normal strength. No cranial nerve deficit or sensory deficit.  Skin: Skin is warm and dry.  Psychiatric: He has a normal mood and affect. His speech is normal.    ED Course  Procedures (including critical care time) Labs Review Labs Reviewed - No data to display Imaging Review No results found.  EKG Interpretation   None       MDM  No diagnosis found. *I have reviewed nursing notes, vital signs, and all appropriate lab and imaging results for this patient.**  The examination is consistent with a groin strain. The patient is advised to rest his groin and back area is much as possible. He is advised to use an ice pack. He is advised to continue his Valium and Percocet. The patient was given intramuscular Dilaudid and Robaxin here in the emergency department. He will followup with his primary physician, he is also given a referral to Dr. Romeo Apple if orthopedic evaluation and management might be needed.  Kathie Dike, PA-C 11/10/13 [phone]

## 2013-11-10 NOTE — ED Notes (Signed)
Rt hip pain for 3 weeks,  Has not seen any medical person to evaluate.  Painful ambulation.  Pain radiates from hip to buttock and around to groin.

## 2013-11-10 NOTE — Discharge Instructions (Signed)
Your examination is consistent with a groin strain. Please rest your groin is much as possible. Apply ice pack to the right groin. Please use the Valium and Percocet for pain and spasm. These medications may cause drowsiness, and or constipation. Please use with caution. Please see your primary physician or the orthopedist listed above if not improving. Groin Strain A groin strain (also called a groin pull) is an injury to the muscles or tendon on the upper inner part of the thigh. These muscles are called the adductor muscles or groin muscles. They are responsible for moving the leg across the body. A muscle strain occurs when a muscle is overstretched and some muscle fibers are torn. A groin strain can range from mild to severe depending on how many muscle fibers are affected and whether the muscle fibers are partially or completely torn.  Groin strains usually occur during exercise or participation in sports. The injury often happens when a sudden, violent force is placed on a muscle, stretching the muscle too far. A strain is more likely to occur when your muscles are not warmed up or if you are not properly conditioned. Depending on the severity of the groin strain, recovery time may vary from a few weeks to several weeks. Severe injuries often require 4 6 weeks for recovery. In these cases, complete healing can take 4 5 months.  CAUSES   Stretching the groin muscles too far or too suddenly, often during side-to-side motion with an abrupt change in direction.  Putting repeated stress on the groin muscles over a long period of time.  Performing vigorous activity without properly stretching the groin muscles beforehand. SYMPTOMS   Pain and tenderness in the groin area. This begins as sharp pain and persists as a dull ache.  Popping or snapping feeling when the injury occurs (for severe strains).  Swelling or bruising.  Muscle spasms.  Weakness in the leg.  Stiffness in the groin area with  decreased ability to move the affected muscles. DIAGNOSIS  Your caregiver will perform a physical exam to diagnose a groin strain. You will be asked about your symptoms and how the injury occurred. X-rays are sometimes needed to rule out a broken bone or cartilage problems. Your caregiver may order a CT scan or MRI if a complete muscle tear is suspected. TREATMENT  A groin strain will often heal on its own. Your caregiver may prescribe medicines to help manage pain and swelling (anti-inflammatory medicine). You may be told to use crutches for the first few days to minimize your pain. HOME CARE INSTRUCTIONS   Rest. Do not use the strained muscle if it causes pain.  Put ice on the injured area.  Put ice in a plastic bag.  Place a towel between your skin and the bag.  Leave the ice on for 15 20 minutes, every 2 3 hours. Do this for the first 2 days after the injury.  Only take over-the-counter or prescription medicines as directed by your caregiver.  Wrap the injured area with an elastic bandage as directed by your caregiver.  Keep the injured leg raised (elevated).  Walk, stretch, and perform range-of-motion exercises to improve blood flow to the injured area. Only perform these activities if you can do so without any pain. To prevent muscle strains:  Warm up before exercise.  Develop proper conditioning and strength in the groin muscles. SEEK IMMEDIATE MEDICAL CARE IF:   You have increased pain or swelling in the affected area.   Your  symptoms are not improving or are getting worse. MAKE SURE YOU:   Understand these instructions.  Will watch your condition.  Will get help right away if you are not doing well or get worse. Document Released: 05/22/2004 Document Revised: 09/10/2012 Document Reviewed: 05/28/2012 [facility] Patient Information 2014 [place], [place].

## 2013-11-10 NOTE — ED Provider Notes (Signed)
Medical screening examination/treatment/procedure(s) were performed by non-physician practitioner and as supervising physician I was immediately available for consultation/collaboration.  EKG Interpretation   None         Gilda Creasehristopher J. Pollina, MD 11/10/13 [phone]

## 2014-04-14 ENCOUNTER — Encounter (HOSPITAL_COMMUNITY): Payer: Self-pay | Admitting: Emergency Medicine

## 2014-04-14 ENCOUNTER — Emergency Department (HOSPITAL_COMMUNITY)
Admission: EM | Admit: 2014-04-14 | Discharge: 2014-04-14 | Disposition: A | Discharge status: Home / Self Care | Payer: Medicaid Other | Attending: Emergency Medicine | Admitting: Emergency Medicine

## 2014-04-14 DIAGNOSIS — Y9289 Other specified places as the place of occurrence of the external cause: Secondary | ICD-10-CM | POA: Insufficient documentation

## 2014-04-14 DIAGNOSIS — Y9389 Activity, other specified: Secondary | ICD-10-CM | POA: Insufficient documentation

## 2014-04-14 DIAGNOSIS — R112 Nausea with vomiting, unspecified: Secondary | ICD-10-CM

## 2014-04-14 DIAGNOSIS — R400 Somnolence: Secondary | ICD-10-CM

## 2014-04-14 DIAGNOSIS — I10 Essential (primary) hypertension: Secondary | ICD-10-CM | POA: Diagnosis not present

## 2014-04-14 DIAGNOSIS — X30XXXA Exposure to excessive natural heat, initial encounter: Secondary | ICD-10-CM | POA: Insufficient documentation

## 2014-04-14 DIAGNOSIS — E119 Type 2 diabetes mellitus without complications: Secondary | ICD-10-CM | POA: Insufficient documentation

## 2014-04-14 DIAGNOSIS — Z87828 Personal history of other (healed) physical injury and trauma: Secondary | ICD-10-CM | POA: Diagnosis not present

## 2014-04-14 DIAGNOSIS — R404 Transient alteration of awareness: Secondary | ICD-10-CM | POA: Diagnosis not present

## 2014-04-14 DIAGNOSIS — Z9089 Acquired absence of other organs: Secondary | ICD-10-CM | POA: Diagnosis not present

## 2014-04-14 DIAGNOSIS — T675XXA Heat exhaustion, unspecified, initial encounter: Secondary | ICD-10-CM | POA: Diagnosis not present

## 2014-04-14 DIAGNOSIS — R4182 Altered mental status, unspecified: Secondary | ICD-10-CM | POA: Diagnosis present

## 2014-04-14 LAB — CBC WITH DIFFERENTIAL/PLATELET
BASOS ABS: 0 10*3/uL (ref 0.0–0.1)
BASOS PCT: 0 % (ref 0–1)
EOS PCT: 2 % (ref 0–5)
Eosinophils Absolute: 0.2 10*3/uL (ref 0.0–0.7)
HEMATOCRIT: 44.2 % (ref 39.0–52.0)
Hemoglobin: 16 g/dL (ref 13.0–17.0)
LYMPHS PCT: 36 % (ref 12–46)
Lymphs Abs: 3.8 10*3/uL (ref 0.7–4.0)
MCH: 33.7 pg (ref 26.0–34.0)
MCHC: 36.2 g/dL — AB (ref 30.0–36.0)
MCV: 93.1 fL (ref 78.0–100.0)
MONO ABS: 0.8 10*3/uL (ref 0.1–1.0)
Monocytes Relative: 8 % (ref 3–12)
Neutro Abs: 5.7 10*3/uL (ref 1.7–7.7)
Neutrophils Relative %: 54 % (ref 43–77)
PLATELETS: 367 10*3/uL (ref 150–400)
RBC: 4.75 MIL/uL (ref 4.22–5.81)
RDW: 12.9 % (ref 11.5–15.5)
WBC: 10.4 10*3/uL (ref 4.0–10.5)

## 2014-04-14 LAB — COMPREHENSIVE METABOLIC PANEL
ALBUMIN: 4.2 g/dL (ref 3.5–5.2)
ALT: 36 U/L (ref 0–53)
AST: 24 U/L (ref 0–37)
Alkaline Phosphatase: 61 U/L (ref 39–117)
Anion gap: 15 (ref 5–15)
BUN: 6 mg/dL (ref 6–23)
CALCIUM: 9.4 mg/dL (ref 8.4–10.5)
CO2: 25 meq/L (ref 19–32)
CREATININE: 0.84 mg/dL (ref 0.50–1.35)
Chloride: 100 mEq/L (ref 96–112)
GFR calc Af Amer: >90 mL/min (ref >90)
GFR calc non Af Amer: >90 mL/min (ref >90)
Glucose, Bld: 202 mg/dL — ABNORMAL HIGH (ref 70–99)
Potassium: 3.8 mEq/L (ref 3.7–5.3)
SODIUM: 140 meq/L (ref 137–147)
TOTAL PROTEIN: 7.8 g/dL (ref 6.0–8.3)
Total Bilirubin: 0.4 mg/dL (ref 0.3–1.2)

## 2014-04-14 LAB — CK: Total CK: 333 U/L — ABNORMAL HIGH (ref 7–232)

## 2014-04-14 LAB — SALICYLATE LEVEL

## 2014-04-14 LAB — LIPASE, BLOOD: Lipase: 29 U/L (ref 11–59)

## 2014-04-14 LAB — TROPONIN I: Troponin I: <0.3 ng/mL (ref <0.30)

## 2014-04-14 LAB — ACETAMINOPHEN LEVEL: Acetaminophen (Tylenol), Serum: <15 ug/mL (ref 10–30)

## 2014-04-14 LAB — CBG MONITORING, ED: GLUCOSE-CAPILLARY: 217 mg/dL — AB (ref 70–99)

## 2014-04-14 MED ORDER — SODIUM CHLORIDE 0.9 % IV SOLN
1000.0000 mL | Freq: Once | INTRAVENOUS | Status: AC
  Administered 2014-04-14: 1000 mL via INTRAVENOUS

## 2014-04-14 MED ORDER — ONDANSETRON HCL 4 MG/2ML IJ SOLN
4.0000 mg | Freq: Once | INTRAMUSCULAR | Status: AC
  Administered 2014-04-14: 4 mg via INTRAVENOUS
  Filled 2014-04-14: qty 2

## 2014-04-14 MED ORDER — SODIUM CHLORIDE 0.9 % IV SOLN
1000.0000 mL | INTRAVENOUS | Status: DC
  Administered 2014-04-14: 1000 mL via INTRAVENOUS

## 2014-04-14 MED ORDER — SODIUM CHLORIDE 0.9 % IV BOLUS (SEPSIS)
1000.0000 mL | Freq: Once | INTRAVENOUS | Status: AC
  Administered 2014-04-14: 1000 mL via INTRAVENOUS

## 2014-04-14 MED ORDER — ONDANSETRON HCL 4 MG PO TABS: 4.0000 mg | ORAL_TABLET | Freq: Three times a day (TID) | ORAL | Status: DC | PRN

## 2014-04-14 NOTE — ED Notes (Addendum)
Patient lethargic, response to voice. Oriented x 4. Family states he has been working in the yard since 1300 today only drinking soda for hydration. CBG 217. Skin red, hot to touch. Refuses rectal temperature. IV established.

## 2014-04-14 NOTE — ED Notes (Signed)
Gave patient drink and meal tray. Patient sitting up in bed eating at this time.

## 2014-04-14 NOTE — ED Notes (Signed)
Patient waking up stating "I want these things taken off. I will take them off myself." Patient remains lying in bed in and out of sleep at this time.

## 2014-04-14 NOTE — Discharge Instructions (Signed)
Drink plenty of fluids. Use the zofran for nausea or vomiting. Try to stay cool, avoid going out during the heat of the day to work.  Return if you feel worse again.    Nausea and Vomiting Nausea is a sick feeling that often comes before throwing up (vomiting). Vomiting is a reflex where stomach contents come out of your mouth. Vomiting can cause severe loss of body fluids (dehydration). Children and elderly adults can become dehydrated quickly, especially if they also have diarrhea. Nausea and vomiting are symptoms of a condition or disease. It is important to find the cause of your symptoms. CAUSES   Direct irritation of the stomach lining. This irritation can result from increased acid production (gastroesophageal reflux disease), infection, food poisoning, taking certain medicines (such as nonsteroidal anti-inflammatory drugs), alcohol use, or tobacco use.  Signals from the brain.These signals could be caused by a headache, heat exposure, an inner ear disturbance, increased pressure in the brain from injury, infection, a tumor, or a concussion, pain, emotional stimulus, or metabolic problems.  An obstruction in the gastrointestinal tract (bowel obstruction).  Illnesses such as diabetes, hepatitis, gallbladder problems, appendicitis, kidney problems, cancer, sepsis, atypical symptoms of a heart attack, or eating disorders.  Medical treatments such as chemotherapy and radiation.  Receiving medicine that makes you sleep (general anesthetic) during surgery. DIAGNOSIS Your caregiver may ask for tests to be done if the problems do not improve after a few days. Tests may also be done if symptoms are severe or if the reason for the nausea and vomiting is not clear. Tests may include:  Urine tests.  Blood tests.  Stool tests.  Cultures (to look for evidence of infection).  X-rays or other imaging studies. Test results can help your caregiver make decisions about treatment or the need for  additional tests. TREATMENT You need to stay well hydrated. Drink frequently but in small amounts.You may wish to drink water, sports drinks, clear broth, or eat frozen ice pops or gelatin dessert to help stay hydrated.When you eat, eating slowly may help prevent nausea.There are also some antinausea medicines that may help prevent nausea. HOME CARE INSTRUCTIONS   Take all medicine as directed by your caregiver.  If you do not have an appetite, do not force yourself to eat. However, you must continue to drink fluids.  If you have an appetite, eat a normal diet unless your caregiver tells you differently.  Eat a variety of complex carbohydrates (rice, wheat, potatoes, bread), lean meats, yogurt, fruits, and vegetables.  Avoid high-fat foods because they are more difficult to digest.  Drink enough water and fluids to keep your urine clear or pale yellow.  If you are dehydrated, ask your caregiver for specific rehydration instructions. Signs of dehydration may include:  Severe thirst.  Dry lips and mouth.  Dizziness.  Dark urine.  Decreasing urine frequency and amount.  Confusion.  Rapid breathing or pulse. SEEK IMMEDIATE MEDICAL CARE IF:   You have blood or brown flecks (like coffee grounds) in your vomit.  You have black or bloody stools.  You have a severe headache or stiff neck.  You are confused.  You have severe abdominal pain.  You have chest pain or trouble breathing.  You do not urinate at least once every 8 hours.  You develop cold or clammy skin.  You continue to vomit for longer than 24 to 48 hours.  You have a fever. MAKE SURE YOU:   Understand these instructions.  Will watch your  condition.  Will get help right away if you are not doing well or get worse. Document Released: 09/24/2005 Document Revised: 12/17/2011 Document Reviewed: 02/21/2011 [facility] Patient Information 2015 [place], [place]. This information is not intended to replace advice  given to you by your health care provider. Make sure you discuss any questions you have with your health care provider.  Heat Disorders Heat related disorders are illnesses caused by continued exposure to hot and humid environments, not drinking enough fluids, and/or your body failing to regulate its temperature correctly. People suffer from heat stress and heat related disorders when their bodies are unable to compensate and cool down through sweating. With sufficient heat, sweating is not enough to keep you cool, and your body temperature can rise quickly. Very high body temperatures can damage your brain and other vital organs. High humidity (moisture in the air), adds to heat stress, because it is harder for sweat to evaporate and cool your body. Heat stress and disorders are not uncommon. Some medicines can increase your risk for heat related illness. Ask your caregiver about your medicines during periods of intense heat.  Heat related disorders include:  Heatstroke. When you cannot sweat or regulate your body temperature in an adequate way. This is very dangerous and can be life threatening. Get emergency medical help.  Heat exhaustion. Overheating causes heavy sweating and a fast heart rate. Your body can still regulate its own temperature.  Heat cramps. Painful, uncontrollable muscle spasms. Can occur during heavy exercise in hot environments.  Sunburn. Skin becomes red and painful (burned) after being out in the sun.  Heat rash. Sweat ducts become blocked, which traps sweat under the skin. This causes blisters and red bumps and may cause an itchy or tingling feeling. PREVENTING HEAT STRESS AND HEAT RELATED DISORDERS Overheating can be dangerous and life threatening. When exercising, working, or doing other activities in hot and humid environments, do the following:  Stay informed by listening to and watching local broadcast weather and safety updates during intense heat.  Air  conditioning is the best way to prevent heat disorders. If your home is not air conditioned, spend time in air conditioned places (malls, [employer] libraries, or heat shelters set up by your local health department).  Wear light-weight, light colored, loose fitting clothing. Wear as little clothing as possible when at home.  Increase your fluid intake. Drink enough water and fluids to keep your urine clear or pale yellow. DO NOT WAIT UNTIL YOU ARE THIRSTY TO DRINK. You may already be heat stressed, and not recognize it.  If your caregiver has suggested that you limit the amount of fluid you drink or has prescribed water pills for a medical problem, ask how much you should drink when the weather is hot.  Do not drink liquids with alcohol, caffeine, or lots of sugar. They can cause more loss of body fluid.  Heavy sweating drains your body's salt and minerals, which must be replaced. If you must exercise in the heat, a sports beverage can replace the salt and minerals you lose in sweat. If you are on a low-salt diet, check with your caregiver before drinking a sports beverage.  Sunburn reduces your body's ability to cool itself and causes a loss of needed body fluids. If you go outdoors, protect yourself from the sun by wearing a wide-brimmed hat, along with sunglasses.  Put on sunscreen of SPF 15 or higher, 30 minutes before going out. (The most effective products say "broad spectrum" or "UVA/UVB  protection" on the label.) Reapply sunscreen frequently -- at least every 1-2 hours.  Take added precautions when both the heat and humidity are high.  Rest often.  Even young and otherwise healthy people can become heat stressed and suffer from a heat disorder, if they participate in strenuous activities during hot weather.  If you must be outdoors, try going out only during morning and evening hours, when it is cooler. Rest often in shady areas, so that your body's temperature can adjust.  If your heart  pounds or you are gasping for breath, STOP all activity. Go immediately to a cool area, or at least into the shade, and rest. This is especially true if you become lightheaded, confused, weak, or faint.  Electric fans may make you comfortable, but they DO NOT prevent heat related problems. SYMPTOMS   Headache.  Nosebleed.  Weakness.  You feel very hot.  Muscle cramps.  Restlessness.  Fainting or dizziness.  Fast breathing and shortness of breath.  Excessive sweating. (There may be little or no sweating in late stages of heat exhaustion.)  Rapid pulse, heart pounding.  Feeling sick to your stomach (nauseous, vomiting).  Skin becoming cold and clammy, or excessively hot and dry. HOME CARE INSTRUCTIONS   Lie down and rest in a cool or air conditioned area.  Drink enough water and fluids to keep your urine clear or pale yellow. Avoid fluids with caffeine or high sugar content. Avoid coffee, tea, alcohol or stimulants.  Do not take salt tablets, unless advised by your caregiver.  Avoid hot foods and heavy meals.  Bathe or shower in cool water.  Wear minimal clothing.  Use a fan. Add cool or warm mist to the air, if possible.  If possible, decrease the use of your stove or oven at home.  Monitor adults at risk at least twice a day, watching closely for signs of heat exhaustion or heat stroke. Infants and young children also require more frequent watching.  Never leave infants, children or pets in a parked car, even if the windows are cracked open.  If you are 74 years of age or older, have a friend or relative call to check on you twice a day during a heat wave. If you know someone in this age group, check on them at least twice a day. SEEK IMMEDIATE MEDICAL CARE IF:  You have a hard time breathing.  You vomit or pass blood in your stool.  You have a seizure, feel dizzy or faint, or pass out.  You develop severe sweating.  Your skin is red, hot and dry (there is  no sweating).  Your urine turns a dark color or has blood in it.  You are making very little or no urine.  You are unable to keep fluids down.  You develop chest or abdominal pain.  You develop a throbbing headache.  You develop nausea or confusion. IF YOU OBSERVE SOMEONE WHO MIGHT HAVE HEAT STROKE This can be life threatening. Call your local emergency services (911 in the [place]  If the victim is in the sun, get him or her to a shady area.  Cool the victim rapidly, using whatever methods you have:  Place the victim in a tub of cool water or a cool shower.  Spray the victim with cool water from a garden hose, or sponge the person with cool water.  Wrap the victim in a cool, wet sheet and fan them.  If emergency medical help is delayed, call  the hospital emergency room or your local emergency services (911 in the [place] for further instructions.  Sometimes a victim's muscles will begin to twitch from heat stroke. If this happens, keep the victim from injuring himself. However, do not place any object in the mouth. Give fluids, unless the muscle twitching makes it difficult or unsafe to do so. If there is vomiting, make sure the airway remains open by turning the victim on his or her side. Document Released: 09/21/2000 Document Revised: 12/17/2011 Document Reviewed: 07/11/2009 [facility] Patient Information 2015 [place], [place]. This information is not intended to replace advice given to you by your health care provider. Make sure you discuss any questions you have with your health care provider.

## 2014-04-14 NOTE — ED Provider Notes (Signed)
CSN: [id number]     Arrival date & time 04/14/14  1652 History  This chart was scribed for Ward Givens, MD by Quintella Reichert, ED scribe.  This patient was seen in room APA03/APA03 and the patient's care was started at 5:59 PM.   Chief Complaint  Patient presents with  . Altered Mental Status    The history is provided by the patient and a relative. No language interpreter was used.   LEVEL 5 CAVEAT FOR ALTERED MENTAL STATUS  HPI Comments: Miguel Weber is a 33 y.o. male who presents to the Emergency Department complaining of AMS that began earlier today.  Pt's fiance states that he has been vomiting yellowish-greenish material 1-2x/day for the past 2 days, and has been unable to keep anything down without vomiting shortly after.  Pt denies diarrhea.  Today he was outside in the heat while moving from 1 PM until 5 PM.  One hour ago he began vomiting again and then began sweating.  Later he fell asleep and became very difficult for family to rouse.  Fiance and her father state that he has been minimally responsive since then.  Family notes that while sleeping he did jump up and state repeatedly that "his baby girl told him to jump up," referring to his daughter who has passed away.  Pt states "my daughter told me to rise" and states that his daughter spoke to him.  He states that this is the first time he has heard her voice. His daughter died at age 55 months.  Family notes that pt's uncle died suddenly from MI on 07-15-24while helping them move, they were just evicted from house where he was living with uncle, and he was very angry about this earlier today when the sheriff and landlord brought a Biomedical scientist and had put all their belongings in trash bags and told them they needed to get them or they were going into the trash.  He was also complaining of chest pain underneath his right breast earlier today after vomiting in the car.  Pt attributes this to his vomiting.  Pt is on Norvasc and Prinzide for HTN  and takes metformin for DM.  He also takes Valium 4x/day and percocet.  He took a Valium one hour ago.  He has not taken any other medications since then.   PCP is Dr. Durene Cal at [facility]   Past Medical History  Diagnosis Date  . Diabetes mellitus   . Hypertension   . Shoulder injury     Past Surgical History  Procedure Laterality Date  . Cholecystectomy      History reviewed. No pertinent family history.   History  Substance Use Topics  . Smoking status: Current Every Day Smoker -- 1.00 packs/day for 22 years    Types: Cigarettes  . Smokeless tobacco: Never Used  . Alcohol Use: No   He is a current 1/2-to-1-pack-per-day smoker.  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      Review of Systems  Cardiovascular: Positive for chest pain.  Gastrointestinal: Positive for nausea and vomiting.  Neurological:       Decreased responsiveness  All other systems reviewed and are negative.     Allergies  Tramadol; Aspirin; and Motrin  Home Medications   Prior to Admission medications   Medication Sig Start Date End Date Taking? Authorizing Provider  amLODipine (NORVASC) 5 MG tablet Take 5 mg by mouth daily.    Historical Provider, MD  diazepam (VALIUM) 10 MG tablet Take 10 mg by mouth 3 (three) times daily.     Historical Provider, MD  diazepam (VALIUM) 5 MG tablet Take 1 tablet (5 mg total) by mouth every 6 (six) hours as needed for anxiety. 11/10/13   Kathie DikeHobson M Bryant, PA-C  lisinopril-hydrochlorothiazide (PRINZIDE,ZESTORETIC) 20-12.5 MG per tablet Take 1 tablet by mouth daily.    Historical Provider, MD  metFORMIN (GLUCOPHAGE) 500 MG tablet Take 500 mg by mouth 2 (two) times daily.    Historical Provider, MD  oxyCODONE-acetaminophen (PERCOCET) 7.5-325 MG per tablet Take 1 tablet by mouth every 6 (six) hours as needed for pain.    Historical Provider, MD  oxyCODONE-acetaminophen  (PERCOCET) 7.5-325 MG per tablet Take 1 tablet by mouth every 6 (six) hours as needed for pain. 11/10/13   Kathie DikeHobson M Bryant, PA-C   BP 159/90  Pulse 96  Temp(Src) 98.5 F (36.9 C) (Oral)  Resp 22  Ht 6\' 5"  (1.956 m)  Wt 320 lb (145.151 kg)  BMI 37.94 kg/m2  SpO2 96%/rectal temp 98.2  Vital signs normal    Physical Exam  Nursing note and vitals reviewed. Constitutional: He appears well-developed and well-nourished.  Non-toxic appearance. He does not appear ill. No distress.  HENT:  Head: Normocephalic and atraumatic.  Right Ear: External ear normal.  Left Ear: External ear normal.  Nose: Nose normal. No mucosal edema or rhinorrhea.  Mouth/Throat: Oropharynx is clear and moist and mucous membranes are normal. No dental abscesses or uvula swelling.  Eyes: Conjunctivae and EOM are normal. Pupils are equal, round, and reactive to light.  Neck: Normal range of motion and full passive range of motion without pain. Neck supple.  Cardiovascular: Normal rate, regular rhythm and normal heart sounds.  Exam reveals no gallop and no friction rub.   No murmur heard. Pulmonary/Chest: Effort normal and breath sounds normal. No respiratory distress. He has no wheezes. He has no rhonchi. He has no rales. He exhibits no tenderness and no crepitus.  Abdominal: Soft. Normal appearance and bowel sounds are normal. He exhibits no distension. There is no tenderness. There is no rebound and no guarding.  Musculoskeletal: Normal range of motion. He exhibits no edema and no tenderness.  Moves all extremities well.   Neurological: He has normal strength. No cranial nerve deficit.  Sleeping, hard to awaken, but when awake appears fully oriented.    Skin: Skin is warm, dry and intact. No rash noted. No erythema. No pallor.  Sunburnt/tanned.    ED Course  Procedures (including critical care time)  Medications  0.9 %  sodium chloride infusion (0 mLs Intravenous Stopped 04/14/14 1833)    Followed by  0.9 %   sodium chloride infusion (1,000 mLs Intravenous New Bag/Given 04/14/14 1833)    Followed by  0.9 %  sodium chloride infusion (1,000 mLs Intravenous New Bag/Given 04/14/14 2004)  ondansetron (ZOFRAN) injection 4 mg (4 mg Intravenous Given 04/14/14 1841)  sodium chloride 0.9 % bolus 1,000 mL (0 mLs Intravenous Stopped 04/14/14 2256)     DIAGNOSTIC STUDIES: Oxygen Saturation is 96% on room air, normal by my interpretation.    COORDINATION OF CARE: 6:10 PM-Discussed treatment plan which includes IV fluids, anti-emetics, labs, and EKG with pt at bedside and pt agreed to plan.   1900 patient is more awake. He sitting up at the bedside patient is complaining of being thirsty.  At time of discharge wife states he is back to his baseline. Patient has had IV fluids and he's been eating and drinking normally. He feels ready to be discharged. He does not want to talk to mental health counselor.  Labs Review Results for orders placed during the hospital encounter of 04/14/14  CBC WITH DIFFERENTIAL      Result Value Ref Range   WBC 10.4  4.0 - 10.5 K/uL   RBC 4.75  4.22 - 5.81 MIL/uL   Hemoglobin 16.0  13.0 - 17.0 g/dL   HCT 82.944.2  56.239.0 - 13.052.0 %   MCV 93.1  78.0 - 100.0 fL   MCH 33.7  26.0 - 34.0 pg   MCHC 36.2 (*) 30.0 - 36.0 g/dL   RDW 86.512.9  78.411.5 - 69.615.5 %   Platelets 367  150 - 400 K/uL   Neutrophils Relative % 54  43 - 77 %   Neutro Abs 5.7  1.7 - 7.7 K/uL   Lymphocytes Relative 36  12 - 46 %   Lymphs Abs 3.8  0.7 - 4.0 K/uL   Monocytes Relative 8  3 - 12 %   Monocytes Absolute 0.8  0.1 - 1.0 K/uL   Eosinophils Relative 2  0 - 5 %   Eosinophils Absolute 0.2  0.0 - 0.7 K/uL   Basophils Relative 0  0 - 1 %   Basophils Absolute 0.0  0.0 - 0.1 K/uL  COMPREHENSIVE METABOLIC PANEL      Result Value Ref Range   Sodium 140  137 - 147 mEq/L   Potassium 3.8  3.7 - 5.3 mEq/L   Chloride 100  96 - 112 mEq/L   CO2 25  19 - 32 mEq/L   Glucose, Bld 202 (*) 70 - 99 mg/dL   BUN 6  6 - 23 mg/dL    Creatinine, Ser 2.950.84  0.50 - 1.35 mg/dL   Calcium 9.4  8.4 - 28.410.5 mg/dL   Total Protein 7.8  6.0 - 8.3 g/dL   Albumin 4.2  3.5 - 5.2 g/dL   AST 24  0 - 37 U/L   ALT 36  0 - 53 U/L   Alkaline Phosphatase 61  39 - 117 U/L   Total Bilirubin 0.4  0.3 - 1.2 mg/dL   GFR calc non Af Amer >90  >90 mL/min   GFR calc Af Amer >90  >90 mL/min   Anion gap 15  5 - 15  TROPONIN I      Result Value Ref Range   Troponin I <0.30  <0.30 ng/mL  ACETAMINOPHEN LEVEL      Result Value Ref Range   Acetaminophen (Tylenol), Serum <15.0  10 - 30 ug/mL  SALICYLATE LEVEL      Result Value Ref Range   Salicylate Lvl <2.0 (*) 2.8 - 20.0 mg/dL  CK      Result Value Ref Range   Total CK 333 (*) 7 - 232 U/L  LIPASE, BLOOD      Result Value Ref Range   Lipase 29  11 - 59 U/L  CBG MONITORING, ED      Result Value Ref Range   Glucose-Capillary 217 (*) 70 - 99 mg/dL   Laboratory interpretation all normal except nonsignificant elevation of CK (no rhabdomyolysis)      Imaging Review No results found.   EKG Interpretation   Date/Time:  Wednesday April 14 2014 18:38:37 EDT Ventricular Rate:  73 PR Interval:  149 QRS Duration: 104  QT Interval:  433 QTC Calculation: 477 R Axis:   54 Text Interpretation:  Sinus rhythm Borderline prolonged QT interval  Otherwise within normal limits No old tracing to compare Confirmed by  Alfred Eckley  MD-I, Ismaeel Arvelo ([id number]) on 04/14/2014 6:44:35 PM      MDM   Final diagnoses:  Nausea and vomiting in adult patient  Heat exhaustion, initial encounter  Somnolence   Discharge Medication List as of 04/14/2014 10:44 PM    START taking these medications   Details  ondansetron (ZOFRAN) 4 MG tablet Take 1 tablet (4 mg total) by mouth every 8 (eight) hours as needed for nausea or vomiting., Starting 04/14/2014, Until Discontinued, Print        Plan discharge  Devoria Albe, MD, FACEP   I personally performed the services described in this documentation, which was scribed in my  presence. The recorded information has been reviewed and considered.  Devoria Albe, MD, Armando Gang    Ward Givens, MD 04/14/14 [phone]

## 2014-04-14 NOTE — ED Notes (Signed)
Pt's sister states pt stated "my daughter told me to rise", sister states pt's daughter has passed away, admits to working in hot weather today due to moving, pt able to states name, month and year correctly

## 2014-04-17 ENCOUNTER — Emergency Department (HOSPITAL_COMMUNITY): Payer: Medicaid Other

## 2014-04-17 ENCOUNTER — Emergency Department (HOSPITAL_COMMUNITY)
Admission: EM | Admit: 2014-04-17 | Discharge: 2014-04-17 | Disposition: A | Discharge status: Home / Self Care | Payer: Medicaid Other | Attending: Emergency Medicine | Admitting: Emergency Medicine

## 2014-04-17 ENCOUNTER — Encounter (HOSPITAL_COMMUNITY): Payer: Self-pay | Admitting: Emergency Medicine

## 2014-04-17 DIAGNOSIS — Z79899 Other long term (current) drug therapy: Secondary | ICD-10-CM | POA: Insufficient documentation

## 2014-04-17 DIAGNOSIS — Y929 Unspecified place or not applicable: Secondary | ICD-10-CM | POA: Diagnosis not present

## 2014-04-17 DIAGNOSIS — Y9301 Activity, walking, marching and hiking: Secondary | ICD-10-CM | POA: Diagnosis not present

## 2014-04-17 DIAGNOSIS — E119 Type 2 diabetes mellitus without complications: Secondary | ICD-10-CM | POA: Insufficient documentation

## 2014-04-17 DIAGNOSIS — Z87828 Personal history of other (healed) physical injury and trauma: Secondary | ICD-10-CM | POA: Diagnosis not present

## 2014-04-17 DIAGNOSIS — I10 Essential (primary) hypertension: Secondary | ICD-10-CM | POA: Diagnosis not present

## 2014-04-17 DIAGNOSIS — S99919A Unspecified injury of unspecified ankle, initial encounter: Secondary | ICD-10-CM | POA: Diagnosis present

## 2014-04-17 DIAGNOSIS — S8990XA Unspecified injury of unspecified lower leg, initial encounter: Secondary | ICD-10-CM | POA: Diagnosis present

## 2014-04-17 DIAGNOSIS — R296 Repeated falls: Secondary | ICD-10-CM | POA: Insufficient documentation

## 2014-04-17 DIAGNOSIS — S8392XA Sprain of unspecified site of left knee, initial encounter: Secondary | ICD-10-CM

## 2014-04-17 DIAGNOSIS — IMO0002 Reserved for concepts with insufficient information to code with codable children: Secondary | ICD-10-CM | POA: Diagnosis not present

## 2014-04-17 DIAGNOSIS — F17200 Nicotine dependence, unspecified, uncomplicated: Secondary | ICD-10-CM | POA: Insufficient documentation

## 2014-04-17 MED ORDER — OXYCODONE-ACETAMINOPHEN 5-325 MG PO TABS
2.0000 | ORAL_TABLET | Freq: Once | ORAL | Status: AC
Start: 2014-04-17 — End: 2014-04-17
  Administered 2014-04-17: 2 via ORAL
  Filled 2014-04-17: qty 2

## 2014-04-17 NOTE — ED Notes (Signed)
I can't put no pressure on my left knee at all per pt.

## 2014-04-17 NOTE — Discharge Instructions (Signed)
Joint Sprain °A sprain is a tear or stretch in the ligaments that hold a joint together. Severe sprains may need as long as 3-6 weeks of immobilization and/or exercises to heal completely. Sprained joints should be rested and protected. If not, they can become unstable and prone to re-injury. Proper treatment can reduce your pain, shorten the period of disability, and reduce the risk of repeated injuries. °TREATMENT  °· Rest and elevate the injured joint to reduce pain and swelling. °· Apply ice packs to the injury for 20-30 minutes every 2-3 hours for the next 2-3 days. °· Keep the injury wrapped in a compression bandage or splint as long as the joint is painful or as instructed by your caregiver. °· Do not use the injured joint until it is completely healed to prevent re-injury and chronic instability. Follow the instructions of your caregiver. °· Long-term sprain management may require exercises and/or treatment by a physical therapist. Taping or special braces may help stabilize the joint until it is completely better. °SEEK MEDICAL CARE IF:  °· You develop increased pain or swelling of the joint. °· You develop increasing redness and warmth of the joint. °· You develop a fever. °· It becomes stiff. °· Your hand or foot gets cold or numb. °Document Released: 11/01/2004 Document Revised: 12/17/2011 Document Reviewed: 10/11/2008 °[employer]® Patient Information ©2015 [employer]. This information is not intended to replace advice given to you by your health care provider. Make sure you discuss any questions you have with your health care provider. ° °

## 2014-04-17 NOTE — ED Provider Notes (Signed)
CSN: [id number]     Arrival date & time 04/17/14  0053 History   First MD Initiated Contact with Patient 04/17/14 0228     Chief Complaint  Patient presents with  . Knee Injury      Patient is a 33 y.o. male presenting with knee pain. The history is provided by the patient.  Knee Pain Location:  Knee and ankle Knee location:  L knee Ankle location:  L ankle Pain details:    Quality:  Aching   Severity:  Severe   Onset quality:  Sudden   Timing:  Constant   Progression:  Worsening Chronicity:  New Relieved by:  Rest Exacerbated by: walking. Ineffective treatments: narcotics. Associated symptoms: no back pain, no muscle weakness and no neck pain   pt reports he was walking when he fell onto left knee.  He also hurt his left ankle No head or neck injury He reports he can not put pressure on his left leg  He took multiple percocet at home earlier tonight but no relief This occurred over 3 hours prior to arrival  Past Medical History  Diagnosis Date  . Diabetes mellitus   . Hypertension   . Shoulder injury    Past Surgical History  Procedure Laterality Date  . Cholecystectomy     History reviewed. No pertinent family history. History  Substance Use Topics  . Smoking status: Current Every Day Smoker -- 1.00 packs/day for 22 years    Types: Cigarettes  . Smokeless tobacco: Never Used  . Alcohol Use: No    Review of Systems  Musculoskeletal: Negative for back pain and neck pain.  Neurological: Negative for weakness.      Allergies  Tramadol; Aspirin; and Motrin  Home Medications   Prior to Admission medications   Medication Sig Start Date End Date Taking? Authorizing Provider  amLODipine (NORVASC) 5 MG tablet Take 5 mg by mouth daily.    Historical Provider, MD  diazepam (VALIUM) 10 MG tablet Take 10 mg by mouth 4 (four) times daily.     Historical Provider, MD  lisinopril-hydrochlorothiazide (PRINZIDE,ZESTORETIC) 20-12.5 MG per tablet Take 1 tablet by mouth  daily.    Historical Provider, MD  metFORMIN (GLUCOPHAGE) 500 MG tablet Take 500 mg by mouth 2 (two) times daily.    Historical Provider, MD  ondansetron (ZOFRAN) 4 MG tablet Take 1 tablet (4 mg total) by mouth every 8 (eight) hours as needed for nausea or vomiting. 04/14/14   Ward GivensIva L Knapp, MD  oxyCODONE-acetaminophen (PERCOCET) 7.5-325 MG per tablet Take 1 tablet by mouth every 6 (six) hours as needed for pain. 11/10/13   Kathie DikeHobson M Bryant, PA-C   BP 119/65  Pulse 85  Temp(Src) 98.5 F (36.9 C) (Oral)  Resp 18  SpO2 98% Physical Exam CONSTITUTIONAL: Well developed/well nourished HEAD: Normocephalic/atraumatic EYES: EOMI/PERRL ENMT: Mucous membranes moist NECK: supple no meningeal signs SPINE:entire spine nontender CV: S1/S2 noted, no murmurs/rubs/gallops noted LUNGS: Lungs are clear to auscultation bilaterally, no apparent distress ABDOMEN: soft, nontender, no rebound or guarding GU:no cva tenderness NEURO: Pt is awake/alert, moves all extremitiesx4 EXTREMITIES: pulses normal, full ROM. Tenderness to palpation of left knee and left ankle.  No tenderness with ROM of left hip.  There is no deformity.  Distal pulses equal/intact   SKIN: warm, color normal PSYCH: no abnormalities of mood noted  ED Course  Procedures    Pt here for mechanical fall with left knee/ankle pain He initially told me he took multiple percocets prior  to arrival.  He also has multiple drug allergies so initially unable to give meds After monitoring in ED he was given percocet and crutches  Imaging Review Dg Ankle Complete Left  04/17/2014   CLINICAL DATA:  Left ankle pain after injury.  EXAM: LEFT ANKLE COMPLETE - 3+ VIEW  COMPARISON:  None.  FINDINGS: There is no evidence of fracture, dislocation, or joint effusion. There is no evidence of arthropathy or other focal bone abnormality. Soft tissues are unremarkable.  IMPRESSION: Negative.   Electronically Signed   By: Burman Nieves M.D.   On: 04/17/2014 03:09    Dg Knee Complete 4 Views Left  04/17/2014   CLINICAL DATA:  Left knee pain after injury.  EXAM: LEFT KNEE - COMPLETE 4+ VIEW  COMPARISON:  None.  FINDINGS: There is no evidence of fracture, dislocation, or joint effusion. There is no evidence of arthropathy or other focal bone abnormality. Soft tissues are unremarkable.  IMPRESSION: Negative.   Electronically Signed   By: Burman Nieves M.D.   On: 04/17/2014 03:09   Dg Foot Complete Left  04/17/2014   CLINICAL DATA:  Pain after injury.  EXAM: LEFT FOOT - COMPLETE 3+ VIEW  COMPARISON:  None.  FINDINGS: There is no evidence of fracture or dislocation. There is no evidence of arthropathy or other focal bone abnormality. Soft tissues are unremarkable.  IMPRESSION: Negative.   Electronically Signed   By: Burman Nieves M.D.   On: 04/17/2014 03:13      MDM   Final diagnoses:  Sprain of left knee, initial encounter    Nursing notes including past medical history and social history reviewed and considered in documentation xrays reviewed and considered     Joya Gaskins, MD 04/17/14 [phone]

## 2014-06-25 ENCOUNTER — Emergency Department (HOSPITAL_COMMUNITY): Payer: Medicaid Other

## 2014-06-25 ENCOUNTER — Emergency Department (HOSPITAL_COMMUNITY)
Admission: EM | Admit: 2014-06-25 | Discharge: 2014-06-25 | Disposition: A | Discharge status: Home / Self Care | Payer: Medicaid Other | Attending: Emergency Medicine | Admitting: Emergency Medicine

## 2014-06-25 ENCOUNTER — Encounter (HOSPITAL_COMMUNITY): Payer: Self-pay | Admitting: Emergency Medicine

## 2014-06-25 DIAGNOSIS — I10 Essential (primary) hypertension: Secondary | ICD-10-CM | POA: Insufficient documentation

## 2014-06-25 DIAGNOSIS — R109 Unspecified abdominal pain: Secondary | ICD-10-CM | POA: Insufficient documentation

## 2014-06-25 DIAGNOSIS — F17200 Nicotine dependence, unspecified, uncomplicated: Secondary | ICD-10-CM | POA: Diagnosis not present

## 2014-06-25 DIAGNOSIS — Z87828 Personal history of other (healed) physical injury and trauma: Secondary | ICD-10-CM | POA: Insufficient documentation

## 2014-06-25 DIAGNOSIS — E119 Type 2 diabetes mellitus without complications: Secondary | ICD-10-CM | POA: Diagnosis not present

## 2014-06-25 DIAGNOSIS — R1010 Upper abdominal pain, unspecified: Secondary | ICD-10-CM

## 2014-06-25 DIAGNOSIS — R112 Nausea with vomiting, unspecified: Secondary | ICD-10-CM | POA: Diagnosis not present

## 2014-06-25 DIAGNOSIS — Z79899 Other long term (current) drug therapy: Secondary | ICD-10-CM | POA: Diagnosis not present

## 2014-06-25 LAB — URINALYSIS, ROUTINE W REFLEX MICROSCOPIC
Bilirubin Urine: NEGATIVE
Glucose, UA: 500 mg/dL — AB
Ketones, ur: NEGATIVE mg/dL
LEUKOCYTES UA: NEGATIVE
NITRITE: NEGATIVE
PH: 5.5 (ref 5.0–8.0)
Protein, ur: NEGATIVE mg/dL
SPECIFIC GRAVITY, URINE: 1.02 (ref 1.005–1.030)
Urobilinogen, UA: 0.2 mg/dL (ref 0.0–1.0)

## 2014-06-25 LAB — CBC WITH DIFFERENTIAL/PLATELET
Basophils Absolute: 0 10*3/uL (ref 0.0–0.1)
Basophils Relative: 0 % (ref 0–1)
EOS ABS: 0.2 10*3/uL (ref 0.0–0.7)
EOS PCT: 1 % (ref 0–5)
HCT: 42.2 % (ref 39.0–52.0)
Hemoglobin: 15.4 g/dL (ref 13.0–17.0)
LYMPHS PCT: 26 % (ref 12–46)
Lymphs Abs: 3.2 10*3/uL (ref 0.7–4.0)
MCH: 33.2 pg (ref 26.0–34.0)
MCHC: 36.5 g/dL — ABNORMAL HIGH (ref 30.0–36.0)
MCV: 90.9 fL (ref 78.0–100.0)
Monocytes Absolute: 0.9 10*3/uL (ref 0.1–1.0)
Monocytes Relative: 7 % (ref 3–12)
Neutro Abs: 8.2 10*3/uL — ABNORMAL HIGH (ref 1.7–7.7)
Neutrophils Relative %: 66 % (ref 43–77)
PLATELETS: 385 10*3/uL (ref 150–400)
RBC: 4.64 MIL/uL (ref 4.22–5.81)
RDW: 12.9 % (ref 11.5–15.5)
WBC: 12.5 10*3/uL — AB (ref 4.0–10.5)

## 2014-06-25 LAB — COMPREHENSIVE METABOLIC PANEL
ALT: 33 U/L (ref 0–53)
AST: 18 U/L (ref 0–37)
Albumin: 4.1 g/dL (ref 3.5–5.2)
Alkaline Phosphatase: 65 U/L (ref 39–117)
Anion gap: 12 (ref 5–15)
BUN: 7 mg/dL (ref 6–23)
CALCIUM: 9.3 mg/dL (ref 8.4–10.5)
CO2: 28 meq/L (ref 19–32)
Chloride: 97 mEq/L (ref 96–112)
Creatinine, Ser: 0.86 mg/dL (ref 0.50–1.35)
GFR calc Af Amer: >90 mL/min (ref >90)
GFR calc non Af Amer: >90 mL/min (ref >90)
Glucose, Bld: 274 mg/dL — ABNORMAL HIGH (ref 70–99)
Potassium: 4 mEq/L (ref 3.7–5.3)
SODIUM: 137 meq/L (ref 137–147)
TOTAL PROTEIN: 8 g/dL (ref 6.0–8.3)
Total Bilirubin: 0.3 mg/dL (ref 0.3–1.2)

## 2014-06-25 LAB — URINE MICROSCOPIC-ADD ON

## 2014-06-25 LAB — LIPASE, BLOOD: LIPASE: 39 U/L (ref 11–59)

## 2014-06-25 LAB — TROPONIN I: Troponin I: <0.3 ng/mL (ref <0.30)

## 2014-06-25 LAB — D-DIMER, QUANTITATIVE (NOT AT ARMC): D DIMER QUANT: 0.34 ug{FEU}/mL (ref 0.00–0.48)

## 2014-06-25 MED ORDER — ONDANSETRON HCL 4 MG/2ML IJ SOLN
INTRAMUSCULAR | Status: AC
  Filled 2014-06-25: qty 2

## 2014-06-25 MED ORDER — HYDROMORPHONE HCL 1 MG/ML IJ SOLN
1.0000 mg | Freq: Once | INTRAMUSCULAR | Status: AC
  Administered 2014-06-25: 1 mg via INTRAVENOUS
  Filled 2014-06-25: qty 1

## 2014-06-25 MED ORDER — HYDROCODONE-ACETAMINOPHEN 5-325 MG PO TABS: 2.0000 | ORAL_TABLET | ORAL | Status: DC | PRN

## 2014-06-25 MED ORDER — MORPHINE SULFATE 4 MG/ML IJ SOLN
4.0000 mg | Freq: Once | INTRAMUSCULAR | Status: AC
  Administered 2014-06-25: 4 mg via INTRAVENOUS
  Filled 2014-06-25: qty 1

## 2014-06-25 MED ORDER — FAMOTIDINE 20 MG PO TABS: 20.0000 mg | ORAL_TABLET | Freq: Two times a day (BID) | ORAL | Status: DC

## 2014-06-25 MED ORDER — IOHEXOL 300 MG/ML  SOLN
50.0000 mL | Freq: Once | INTRAMUSCULAR | Status: AC | PRN
  Administered 2014-06-25: 50 mL via ORAL

## 2014-06-25 MED ORDER — IOHEXOL 300 MG/ML  SOLN
100.0000 mL | Freq: Once | INTRAMUSCULAR | Status: AC | PRN
  Administered 2014-06-25: 100 mL via INTRAVENOUS

## 2014-06-25 MED ORDER — GI COCKTAIL ~~LOC~~
30.0000 mL | Freq: Once | ORAL | Status: AC
  Administered 2014-06-25: 30 mL via ORAL
  Filled 2014-06-25: qty 30

## 2014-06-25 MED ORDER — ONDANSETRON HCL 4 MG/2ML IJ SOLN
4.0000 mg | Freq: Once | INTRAMUSCULAR | Status: AC
  Administered 2014-06-25: 4 mg via INTRAVENOUS

## 2014-06-25 MED ORDER — ONDANSETRON HCL 4 MG PO TABS: 4.0000 mg | ORAL_TABLET | Freq: Three times a day (TID) | ORAL | Status: DC | PRN

## 2014-06-25 NOTE — ED Notes (Signed)
Productive cough noted, yellow in color. NAD. PT states epigastric and back pain since yesterday. NAD.

## 2014-06-25 NOTE — Discharge Instructions (Signed)
Your testing including evaluation for surgical problems has been normal - you have a normal heart EKG and your blood work was unremarkable thankfully - please use zofran for nausea - start pepcid nightly for acid control and follow up closely on Monday with your doctor.  If you don't have a physician, see below.  [facility] Primary Care Doctor List    Kari Baars MD. Specialty: Pulmonary Disease Contact information: [address]  [address]  [place] [address]  [phone]   Syliva Overman, MD. Specialty: [facility] Medicine Contact information: [address]  [place] [address]  [phone]   Lilyan Punt, MD. Specialty: [facility] Medicine Contact information: [address]  [place] [address]  [phone]   Avon Gully, MD Specialty: Internal Medicine Contact information: [address] [place] [address]  [phone]   Catalina Pizza, MD. Specialty: Internal Medicine Contact information: [address]  [place] [address]  [phone]   Butch Penny, MD. Specialty: Family Medicine Contact information: [address]  [place] [address]  [phone]   John Giovanni, MD. Specialty: [facility] Medicine Contact information: [address]  [address]  [place] [address]  [phone]   Carylon Perches, MD. Specialty: Internal Medicine Contact information: [address]  [address]  [place] [address]  [phone]

## 2014-06-25 NOTE — ED Notes (Addendum)
Pt reports back pain since yesterday and epigastric pain, coughing, nausea since today. Pt reports green sputum. nad noted. Pt reports at pain onset pt was diaphoretic. Pt non-diaphoretic at this time. Mild dyspnea noted. Pt reports chest discomfort at rest but is increased with coughing.

## 2014-06-25 NOTE — ED Provider Notes (Signed)
CSN: [id number]     Arrival date & time 06/25/14  1548 History   First MD Initiated Contact with Patient 06/25/14 1606     Chief Complaint  Patient presents with  . Abdominal Pain     (Consider location/radiation/quality/duration/timing/severity/associated sxs/prior Treatment) HPI Comments: The patient is a 33 year old Weber with a history of diabetes and hypertension with a prior cholecystectomy who presents with a complaint of back pain which he describes as a feeling of being hit with a baseball bat from his lower back all the way up to the top of his back. This started yesterday, it has been persistent over yesterday and today.   This pain is now in his abdomen as well right greater than left in the upper abdomen. He has had a significant cough over the last 24 hours and has had some posttussive emesis with a sore throat. He denies fevers but has had some chills and states that he was very sweaty when he woke up this morning. He denies chest pain swelling of the legs or diarrhea. His appetite has been normal. Nothing seems to make this back pain better, it is worse with position  Patient is a 33 y.o. [place] presenting with abdominal pain. The history is provided by the patient.  Abdominal Pain   Past Medical History  Diagnosis Date  . Diabetes mellitus   . Hypertension   . Shoulder injury    Past Surgical History  Procedure Laterality Date  . Cholecystectomy     History reviewed. No pertinent family history. History  Substance Use Topics  . Smoking status: Current Every Day Smoker -- 0.50 packs/day for 22 years    Types: Cigarettes  . Smokeless tobacco: Never Used  . Alcohol Use: No    Review of Systems  Gastrointestinal: Positive for abdominal pain.  All other systems reviewed and are negative.     Allergies  Tramadol; Aspirin; and Motrin  Home Medications   Prior to Admission medications   Medication Sig Start Date End Date Taking? Authorizing Provider  amLODipine  (NORVASC) 5 MG tablet Take 5 mg by mouth daily.   Yes Historical Provider, MD  diazepam (VALIUM) 10 MG tablet Take 10 mg by mouth 4 (four) times daily.    Yes Historical Provider, MD  lisinopril-hydrochlorothiazide (PRINZIDE,ZESTORETIC) 20-12.5 MG per tablet Take 1 tablet by mouth daily.   Yes Historical Provider, MD  metFORMIN (GLUCOPHAGE) 500 MG tablet Take 500 mg by mouth 2 (two) times daily.   Yes Historical Provider, MD  oxyCODONE-acetaminophen (PERCOCET) 10-325 MG per tablet Take 1 tablet by mouth every 4 (four) hours as needed for pain.   Yes Historical Provider, MD  famotidine (PEPCID) 20 MG tablet Take 1 tablet (20 mg total) by mouth 2 (two) times daily. 06/25/14   Vida Roller, MD  HYDROcodone-acetaminophen (NORCO/VICODIN) 5-325 MG per tablet Take 2 tablets by mouth every 4 (four) hours as needed for moderate pain. 06/25/14   Vida Roller, MD  ondansetron (ZOFRAN) 4 MG tablet Take 1 tablet (4 mg total) by mouth every 8 (eight) hours as needed for nausea or vomiting. 06/25/14   Vida Roller, MD   BP 132/66  Pulse 85  Temp(Src) 99.1 F (37.3 C) (Oral)  Resp 11  Ht  (1.93 m)  Wt 325 lb (147.419 kg)  BMI 39.58 kg/m2  SpO2 95% Physical Exam  Nursing note and vitals reviewed. Constitutional: He appears well-developed and well-nourished. No distress.  HENT:  Head: Normocephalic and atraumatic.  Mouth/Throat: Oropharynx is clear and moist. No oropharyngeal exudate.  Eyes: Conjunctivae and EOM are normal. Pupils are equal, round, and reactive to light. Right eye exhibits no discharge. Left eye exhibits no discharge. No scleral icterus.  Neck: Normal range of motion. Neck supple. No JVD present. No thyromegaly present.  Cardiovascular: Normal rate, regular rhythm, normal heart sounds and intact distal pulses.  Exam reveals no gallop and no friction rub.   No murmur heard. Pulmonary/Chest: Effort normal and breath sounds normal. No respiratory distress. He has no wheezes. He has no  rales.  Abdominal: Soft. Bowel sounds are normal. He exhibits no distension and no mass. There is tenderness ( Mild diffuse tenderness, right upper quadrant more tender than any other location. Mild guarding, no peritoneal signs).  Musculoskeletal: Normal range of motion. He exhibits tenderness (tenderness with palpation to the muscles of the back including the paraspinal muscles, rhomboids and his lower back, no central tenderness). He exhibits no edema.  Lymphadenopathy:    He has no cervical adenopathy.  Neurological: He is alert. Coordination normal.  Skin: Skin is warm and dry. No rash noted. No erythema.  Psychiatric: He has a normal mood and affect. His behavior is normal.    ED Course  Procedures (including critical care time) Labs Review Labs Reviewed  CBC WITH DIFFERENTIAL - Abnormal; Notable for the following:    WBC 12.5 (*)    MCHC 36.5 (*)    Neutro Abs 8.2 (*)    All other components within normal limits  COMPREHENSIVE METABOLIC PANEL - Abnormal; Notable for the following:    Glucose, Bld 274 (*)    All other components within normal limits  URINALYSIS, ROUTINE W REFLEX MICROSCOPIC - Abnormal; Notable for the following:    Glucose, UA 500 (*)    Hgb urine dipstick TRACE (*)    All other components within normal limits  TROPONIN I  LIPASE, BLOOD  URINE MICROSCOPIC-ADD ON  D-DIMER, QUANTITATIVE    Imaging Review Dg Chest 2 View  06/25/2014   CLINICAL DATA:  Abdominal pain, chest pain.  EXAM: CHEST  2 VIEW  COMPARISON:  None.  FINDINGS: Heart and mediastinal contours are within normal limits. No focal opacities or effusions. No acute bony abnormality.  IMPRESSION: No active cardiopulmonary disease.   Electronically Signed   By: Charlett Nose M.D.   On: 06/25/2014 18:09   Ct Abdomen Pelvis W Contrast  06/25/2014   CLINICAL DATA:  Abdominal pain, back pain.  EXAM: CT ABDOMEN AND PELVIS WITH CONTRAST  TECHNIQUE: Multidetector CT imaging of the abdomen and pelvis was  performed using the standard protocol following bolus administration of intravenous contrast.  CONTRAST:  50mL OMNIPAQUE IOHEXOL 300 MG/ML SOLN, OMNIPAQUE IOHEXOL 300 MG/ML SOLN  COMPARISON:  None.  FINDINGS: Lung bases are clear.  No effusions.  Heart is normal size.  Diffuse fatty infiltration of the liver. No focal abnormality. Prior cholecystectomy. Spleen, pancreas, adrenals and kidneys are unremarkable.  Appendix is visualized and is normal. Stomach, large and small bowel unremarkable. No free fluid, free air or adenopathy. Urinary bladder unremarkable. Aorta is normal caliber.  No acute bony abnormality.  IMPRESSION: Diffuse fatty infiltration of the liver.   Electronically Signed   By: Charlett Nose M.D.   On: 06/25/2014 17:54     EKG Interpretation   Date/Time:  Friday June 25 2014 15:Miguel:[id number] EDT Ventricular Rate:  103 PR Interval:  136 QRS Duration: 96 QT Interval:  358 QTC Calculation: 468 R Axis:  80 Text Interpretation:  Sinus tachycardia Otherwise normal ECG since last  tracing no significant change Confirmed by Varnell Donate  MD, Trea Latner [phone]) on  06/25/2014 5:33:08 PM      MDM   Final diagnoses:  Pain of upper abdomen  Non-intractable vomiting with nausea, vomiting of unspecified type    The patient has normal vascular flow based on pulses of his feet, arms , doubt dissection, consider pancreatitis, hepatitis, peptic ulcer disease, colitis. Check urinalysis as well. Pain medications ordered.  Trop and ddimer neg, pt now stating that he has had this before without answer., stable for d/c.  Meds given in ED:  Medications  ondansetron (ZOFRAN) 4 MG/2ML injection (not administered)  HYDROmorphone (DILAUDID) injection 1 mg (1 mg Intravenous Given 06/25/14 1649)  iohexol (OMNIPAQUE) 300 MG/ML solution 50 mL (50 mLs Oral Contrast Given 06/25/14 1630)  iohexol (OMNIPAQUE) 300 MG/ML solution 100 mL (100 mLs Intravenous Contrast Given 06/25/14 1739)  morphine 4 MG/ML  injection 4 mg (4 mg Intravenous Given 06/25/14 1837)  gi cocktail (Maalox,Lidocaine,Donnatal) (30 mLs Oral Given 06/25/14 1900)  HYDROmorphone (DILAUDID) injection 1 mg (1 mg Intravenous Given 06/25/14 1944)  ondansetron (ZOFRAN) injection 4 mg (4 mg Intravenous Given 06/25/14 2036)    New Prescriptions   FAMOTIDINE (PEPCID) 20 MG TABLET    Take 1 tablet (20 mg total) by mouth 2 (two) times daily.   HYDROCODONE-ACETAMINOPHEN (NORCO/VICODIN) 5-325 MG PER TABLET    Take 2 tablets by mouth every 4 (four) hours as needed for moderate pain.   ONDANSETRON (ZOFRAN) 4 MG TABLET    Take 1 tablet (4 mg total) by mouth every 8 (eight) hours as needed for nausea or vomiting.      Vida Roller, MD 06/25/14 2056

## 2014-06-25 NOTE — ED Notes (Signed)
Dr Edward Qualia changed discharge order of Norco/Vicodin 5-325mg  from 4 tablet pack to 6 tablet pack

## 2014-06-25 NOTE — ED Notes (Signed)
Patient in room vomiting. Dr. Hyacinth Meeker notified and orders received. Patient given zofran after vomiting and patient states that he feels better at this time. Will continue to monitor, family at bedside and call bell within reach

## 2014-10-02 ENCOUNTER — Emergency Department (HOSPITAL_COMMUNITY): Payer: Medicaid Other

## 2014-10-02 ENCOUNTER — Encounter (HOSPITAL_COMMUNITY): Payer: Self-pay | Admitting: Emergency Medicine

## 2014-10-02 ENCOUNTER — Emergency Department (HOSPITAL_COMMUNITY)
Admission: EM | Admit: 2014-10-02 | Discharge: 2014-10-02 | Disposition: A | Discharge status: Home / Self Care | Payer: Medicaid Other | Attending: Emergency Medicine | Admitting: Emergency Medicine

## 2014-10-02 DIAGNOSIS — Z720 Tobacco use: Secondary | ICD-10-CM | POA: Diagnosis not present

## 2014-10-02 DIAGNOSIS — F4310 Post-traumatic stress disorder, unspecified: Secondary | ICD-10-CM | POA: Diagnosis not present

## 2014-10-02 DIAGNOSIS — S3992XA Unspecified injury of lower back, initial encounter: Secondary | ICD-10-CM | POA: Diagnosis not present

## 2014-10-02 DIAGNOSIS — I10 Essential (primary) hypertension: Secondary | ICD-10-CM | POA: Insufficient documentation

## 2014-10-02 DIAGNOSIS — S8992XA Unspecified injury of left lower leg, initial encounter: Secondary | ICD-10-CM | POA: Diagnosis not present

## 2014-10-02 DIAGNOSIS — Z87828 Personal history of other (healed) physical injury and trauma: Secondary | ICD-10-CM | POA: Diagnosis not present

## 2014-10-02 DIAGNOSIS — Z79899 Other long term (current) drug therapy: Secondary | ICD-10-CM | POA: Diagnosis not present

## 2014-10-02 DIAGNOSIS — E119 Type 2 diabetes mellitus without complications: Secondary | ICD-10-CM | POA: Diagnosis not present

## 2014-10-02 DIAGNOSIS — Y9389 Activity, other specified: Secondary | ICD-10-CM | POA: Diagnosis not present

## 2014-10-02 DIAGNOSIS — Y9289 Other specified places as the place of occurrence of the external cause: Secondary | ICD-10-CM | POA: Insufficient documentation

## 2014-10-02 DIAGNOSIS — Y998 Other external cause status: Secondary | ICD-10-CM | POA: Insufficient documentation

## 2014-10-02 DIAGNOSIS — W1830XA Fall on same level, unspecified, initial encounter: Secondary | ICD-10-CM | POA: Insufficient documentation

## 2014-10-02 DIAGNOSIS — M549 Dorsalgia, unspecified: Secondary | ICD-10-CM

## 2014-10-02 LAB — URINE MICROSCOPIC-ADD ON

## 2014-10-02 LAB — RAPID URINE DRUG SCREEN, HOSP PERFORMED
AMPHETAMINES: NOT DETECTED
BENZODIAZEPINES: POSITIVE — AB
Barbiturates: NOT DETECTED
Cocaine: NOT DETECTED
OPIATES: NOT DETECTED
Tetrahydrocannabinol: POSITIVE — AB

## 2014-10-02 LAB — URINALYSIS, ROUTINE W REFLEX MICROSCOPIC
Bilirubin Urine: NEGATIVE
GLUCOSE, UA: NEGATIVE mg/dL
Ketones, ur: NEGATIVE mg/dL
Leukocytes, UA: NEGATIVE
NITRITE: NEGATIVE
PH: 5.5 (ref 5.0–8.0)
Protein, ur: NEGATIVE mg/dL
Specific Gravity, Urine: 1.01 (ref 1.005–1.030)
Urobilinogen, UA: 0.2 mg/dL (ref 0.0–1.0)

## 2014-10-02 MED ORDER — HYDROCODONE-ACETAMINOPHEN 5-325 MG PO TABS: 2.0000 | ORAL_TABLET | ORAL | Status: DC | PRN

## 2014-10-02 MED ORDER — HYDROCODONE-ACETAMINOPHEN 5-325 MG PO TABS
2.0000 | ORAL_TABLET | Freq: Once | ORAL | Status: AC
  Administered 2014-10-02: 2 via ORAL
  Filled 2014-10-02: qty 2

## 2014-10-02 MED ORDER — DIAZEPAM 10 MG PO TABS: 10.0000 mg | ORAL_TABLET | Freq: Four times a day (QID) | ORAL | Status: DC | PRN

## 2014-10-02 NOTE — ED Notes (Signed)
Patient given discharge instruction, verbalized understand. Patient ambulatory out of the department.  

## 2014-10-02 NOTE — ED Notes (Signed)
Pt reports he was carrying a tv in his yard and slipped and fell and hurt hisb back. Pt also reports he recently lost 3 family members in the last 6 months and his nerves are "shot". Pt denies any si/hi.

## 2014-10-02 NOTE — [facility] Counselor (Signed)
Consulted with Dr. Manus Gunningancour about the Patient.  Dr Manus Gunningancour reports he needs medication recommendation for the Patient because he is currently experiencing PTSD symptoms and is prescribed Valium 4x per day.   The information was provided to Claudette Headonrad Withrow, Extender, who reports he will call Dr. Manus Gunningancour to consult.

## 2014-10-02 NOTE — ED Provider Notes (Signed)
CSN: [id number]     Arrival date & time 10/02/14  1600 History  This chart was scribed for Glynn OctaveStephen Randy Whitener, MD by Roxy Cedarhandni Bhalodia, ED Scribe. This patient was seen in room [facility] and the patient's care was started at 5:59 PM.   Chief Complaint  Patient presents with  . Anxiety   Patient is a 33 y.o. male presenting with anxiety. The history is provided by the patient. No language interpreter was used.  Anxiety   HPI Comments: Miguel KussmaulRichie Dolezal is a 33 y.o. male with a history of diabetes, hypertension, shoulder injury, and cholecystectomy, who presents to the Emergency Department complaining of back pain that began yesterday. Patient states that he slipped and fell while carrying a TV yesterday. Patient reports most pain in his upper, mid and lower back. He also reports associated left knee pain. He denies radiation of back pain. He denies associated chest pain, trouble breathing or abdominal pain.He denies S/I or H/I. He reports that his mother recently passed away due to an MVC and was cremated yesterday. He reports associated anxiety.  Past Medical History  Diagnosis Date  . Diabetes mellitus   . Hypertension   . Shoulder injury    Past Surgical History  Procedure Laterality Date  . Cholecystectomy     No family history on file. History  Substance Use Topics  . Smoking status: Current Every Day Smoker -- 0.50 packs/day for 22 years    Types: Cigarettes  . Smokeless tobacco: Never Used  . Alcohol Use: No   Review of Systems  A complete 10 system review of systems was obtained and all systems are negative except as noted in the HPI and PMH.    Allergies  Tramadol; Aspirin; and Motrin  Home Medications   Prior to Admission medications   Medication Sig Start Date End Date Taking? Authorizing Provider  amLODipine (NORVASC) 5 MG tablet Take 5 mg by mouth daily.   Yes Historical Provider, MD  lisinopril-hydrochlorothiazide (PRINZIDE,ZESTORETIC) 20-12.5 MG per tablet Take 1  tablet by mouth daily.   Yes Historical Provider, MD  metFORMIN (GLUCOPHAGE) 500 MG tablet Take 500 mg by mouth 2 (two) times daily.   Yes Historical Provider, MD  diazepam (VALIUM) 10 MG tablet Take 1 tablet (10 mg total) by mouth every 6 (six) hours as needed for anxiety or muscle spasms. 10/02/14   Glynn OctaveStephen Wise Fees, MD  famotidine (PEPCID) 20 MG tablet Take 1 tablet (20 mg total) by mouth 2 (two) times daily. Patient not taking: Reported on 10/02/2014 06/25/14   Vida RollerBrian D Miller, MD  HYDROcodone-acetaminophen (NORCO/VICODIN) 5-325 MG per tablet Take 2 tablets by mouth every 4 (four) hours as needed. 10/02/14   Glynn OctaveStephen Emmeline Winebarger, MD  ondansetron (ZOFRAN) 4 MG tablet Take 1 tablet (4 mg total) by mouth every 8 (eight) hours as needed for nausea or vomiting. Patient not taking: Reported on 10/02/2014 06/25/14   Vida RollerBrian D Miller, MD   Triage Vitals: BP 150/88 mmHg  Pulse 82  Temp(Src) 99 F (37.2 C) (Oral)  Resp 16  Ht 6\' 5"  (1.956 m)  Wt 300 lb (136.079 kg)  BMI 35.57 kg/m2  SpO2 98%  Physical Exam  Constitutional: He is oriented to person, place, and time. He appears well-developed and well-nourished. No distress.  HENT:  Head: Normocephalic and atraumatic.  Mouth/Throat: Oropharynx is clear and moist. No oropharyngeal exudate.  Eyes: Conjunctivae and EOM are normal. Pupils are equal, round, and reactive to light.  Neck: Normal range of motion. Neck supple.  No meningismus.  Cardiovascular: Normal rate, regular rhythm, normal heart sounds and intact distal pulses.   No murmur heard. Pulmonary/Chest: Effort normal and breath sounds normal. No respiratory distress.  Abdominal: Soft. There is no tenderness. There is no rebound and no guarding.  Musculoskeletal: Normal range of motion. He exhibits tenderness. He exhibits no edema.  Diffuse paraspinal T and L spine tenderness  Neurological: He is alert and oriented to person, place, and time. No cranial nerve deficit. He exhibits normal muscle  tone. Coordination normal.  No ataxia on finger to nose bilaterally. No pronator drift. 5/5 strength throughout. CN 2-12 intact. Negative Romberg. Equal grip strength. Sensation intact. Gait is normal.   Skin: Skin is warm.  Psychiatric: He has a normal mood and affect. His behavior is normal.  Nursing note and vitals reviewed.  ED Course  Procedures (including critical care time)  DIAGNOSTIC STUDIES: Oxygen Saturation is 98% on RA, normal by my interpretation.    COORDINATION OF CARE: 6:05 PM- Discussed plans to order diagnostic urinalysis, imaging of back. Pt advised of plan for treatment and pt agrees.  7:32 PM - Discussed plans to give patient pain medication and anti-inflammatory medication. Discussed plans to discharge patient.   Labs Review Labs Reviewed  URINALYSIS, ROUTINE W REFLEX MICROSCOPIC - Abnormal; Notable for the following:    Hgb urine dipstick TRACE (*)    All other components within normal limits  URINE RAPID DRUG SCREEN (HOSP PERFORMED) - Abnormal; Notable for the following:    Benzodiazepines POSITIVE (*)    Tetrahydrocannabinol POSITIVE (*)    All other components within normal limits  URINE MICROSCOPIC-ADD ON   Imaging Review Dg Thoracic Spine 2 View  10/02/2014   CLINICAL DATA:  Fall yesterday with persistent back pain, initial encounter  EXAM: THORACIC SPINE - 2 VIEW  COMPARISON:  None.  FINDINGS: There is no evidence of thoracic spine fracture. Alignment is normal. No other significant bone abnormalities are identified.  IMPRESSION: No acute abnormality noted.   Electronically Signed   By: Alcide CleverMark  Lukens M.D.   On: 10/02/2014 19:18   Dg Lumbar Spine Complete  10/02/2014   CLINICAL DATA:  Fall yesterday with persistent back pain  EXAM: LUMBAR SPINE - COMPLETE 4+ VIEW  COMPARISON:  None.  FINDINGS: There is no evidence of lumbar spine fracture. Alignment is normal. Intervertebral disc spaces are maintained.  IMPRESSION: No acute abnormality noted.    Electronically Signed   By: Alcide CleverMark  Lukens M.D.   On: 10/02/2014 19:20     EKG Interpretation None     MDM   Final diagnoses:  Back pain  Post traumatic stress disorder   Back pain after lifting TV yesterday. No focal weakness, numbness or tingling. No bowel or bladder incontinence.  Normal strength and sensation on exam. No evidence of cauda equina or cord compression. X-rays negative. No history of cancer or IVDA.  [facility]. Patient with no SI or HI. He has issues with PTSD and seeing his dead mother. He is already on Valium 4 times daily. Recommendations to continue that medication and give outpatient resources.  Patient states he is out of his Valium. He does not have an appointment for 2 weeks. Small amount will be prescribed to prevent withdrawal.  I personally performed the services described in this documentation, which was scribed in my presence. The recorded information has been reviewed and is accurate.  Glynn OctaveStephen Aalaiyah Yassin, MD 10/03/14 [phone]

## 2014-10-02 NOTE — ED Notes (Signed)
Patient ambulatory to restroom with steady gait, clean catch instructions given and advised pt to bring specimen back to room as well.  

## 2014-10-02 NOTE — Discharge Instructions (Signed)
Back Pain, Adult °Low back pain is very common. About 1 in 5 people have back pain. The cause of low back pain is rarely dangerous. The pain often gets better over time. About half of people with a sudden onset of back pain feel better in just 2 weeks. About 8 in 10 people feel better by 6 weeks.  °CAUSES °Some common causes of back pain include: °· Strain of the muscles or ligaments supporting the spine. °· Wear and tear (degeneration) of the spinal discs. °· Arthritis. °· Direct injury to the back. °DIAGNOSIS °Most of the time, the direct cause of low back pain is not known. However, back pain can be treated effectively even when the exact cause of the pain is unknown. Answering your caregiver's questions about your overall health and symptoms is one of the most accurate ways to make sure the cause of your pain is not dangerous. If your caregiver needs more information, he or she may order lab work or imaging tests (X-rays or MRIs). However, even if imaging tests show changes in your back, this usually does not require surgery. °HOME CARE INSTRUCTIONS °For many people, back pain returns. Since low back pain is rarely dangerous, it is often a condition that people can learn to manage on their own.  °· Remain active. It is stressful on the back to sit or stand in one place. Do not sit, drive, or stand in one place for more than 30 minutes at a time. Take short walks on level surfaces as soon as pain allows. Try to increase the length of time you walk each day. °· Do not stay in bed. Resting more than 1 or 2 days can delay your recovery. °· Do not avoid exercise or work. Your body is made to move. It is not dangerous to be active, even though your back may hurt. Your back will likely heal faster if you return to being active before your pain is gone. °· Pay attention to your body when you  bend and lift. Many people have less discomfort when lifting if they bend their knees, keep the load close to their bodies, and  avoid twisting. Often, the most comfortable positions are those that put less stress on your recovering back. °· Find a comfortable position to sleep. Use a firm mattress and lie on your side with your knees slightly bent. If you lie on your back, put a pillow under your knees. °· Only take over-the-counter or prescription medicines as directed by your caregiver. Over-the-counter medicines to reduce pain and inflammation are often the most helpful. Your caregiver may prescribe muscle relaxant drugs. These medicines help dull your pain so you can more quickly return to your normal activities and healthy exercise. °· Put ice on the injured area. °¨ Put ice in a plastic bag. °¨ Place a towel between your skin and the bag. °¨ Leave the ice on for 15-20 minutes, 03-04 times a day for the first 2 to 3 days. After that, ice and heat may be alternated to reduce pain and spasms. °· Ask your caregiver about trying back exercises and gentle massage. This may be of some benefit. °· Avoid feeling anxious or stressed. Stress increases muscle tension and can worsen back pain. It is important to recognize when you are anxious or stressed and learn ways to manage it. Exercise is a great option. °SEEK MEDICAL CARE IF: °· You have pain that is not relieved with rest or medicine. °· You have pain that does not improve in 1 week. °· You have new symptoms. °· You are generally not feeling well. °SEEK   IMMEDIATE MEDICAL CARE IF:  °· You have pain that radiates from your back into your legs. °· You develop new bowel or bladder control problems. °· You have unusual weakness or numbness in your arms or legs. °· You develop nausea or vomiting. °· You develop abdominal pain. °· You feel faint. °Document Released: 09/24/2005 Document Revised: 03/25/2012 Document Reviewed: 01/26/2014 °ExitCare® Patient Information ©2015 [employer]. This information is not intended to replace advice given to you by your health care provider. Make sure you  discuss any questions you have with your health care provider. ° ° ° ° °Emergency Department Resource Guide °1) Find a Doctor and Pay Out of Pocket °Although you won't have to find out who is covered by your insurance plan, it is a good idea to ask around and get recommendations. You will then need to call the office and see if the doctor you have chosen will accept you as a new patient and what types of options they offer for patients who are self-pay. Some doctors offer discounts or will set up payment plans for their patients who do not have insurance, but you will need to ask so you aren't surprised when you get to your appointment. ° °2) Contact Your Local Health Department °Not all health departments have doctors that can see patients for sick visits, but many do, so it is worth a call to see if yours does. If you don't know where your local health department is, you can check in your phone book. The CDC also has a tool to help you locate your state's health department, and many state websites also have listings of all of their local health departments. ° °3) Find a Walk-in Clinic °If your illness is not likely to be very severe or complicated, you may want to try a walk in clinic. These are popping up all over the country in pharmacies, drugstores, and shopping centers. They're usually staffed by nurse practitioners or physician assistants that have been trained to treat common illnesses and complaints. They're usually fairly quick and inexpensive. However, if you have serious medical issues or chronic medical problems, these are probably not your best option. ° °No Primary Care Doctor: °- Call [facility] at  832-8000 - they can help you locate a primary care doctor that  accepts your insurance, provides certain services, etc. °- Physician Referral Service- 1-800-533-3463 ° °Chronic Pain Problems: °Organization         Address  Phone   Notes  °[facility]  (336) 297-2271 Patients need  to be referred by their primary care doctor.  ° °Medication Assistance: °Organization         Address  Phone   Notes  °[place] [facility] [address] °[place], [place] [address] (336) 641-8030 --Must be a resident of [place] °-- Must have NO insurance coverage whatsoever (no Medicaid/ Medicare, etc.) °-- The pt. MUST have a primary care doctor that directs their care regularly and follows them in the community °  °[facility]  (866) 331-1348   °[employer]  (888) 892-1162   ° °Agencies that provide inexpensive medical care: °Organization         Address  Phone   Notes  °[facility]  (336) 832-8035   °[facility]    (336) 832-7272   °[facility] [address] °[place], [place] [address] (336) 832-4777   °[facility] [address], °[place] (336) 271-4999   °[facility]    (336) 373-0678   °[facility]    (336) 272-1050   °[facility] ° [address], [place] Phone:  (336) 832-4444, Fax:  (336) 832-4440 Hours of Operation:  9 am - 6 pm, M-F.  Also accepts Medicaid/Medicare and self-pay.  °[facility] ° [address], [place] Phone: (336) 832-3150, Fax: (336) [phone]. Hours of Operation:  8:30 am - 5:30 pm, M-F.  Also accepts Medicaid and self-pay.  °[facility] [place] [address], [place] Phone: (336) 878-6027   °[facility] [address], [place], [place] (336)723-1848, Ext. 123 Mondays & Thursdays: 7-9 AM.  First 15 patients are seen on a first come, first serve basis. °  ° °Medicaid-accepting [place] Providers: ° °Organization         Address  Phone   Notes  °[facility] [address], [place] (336) 641-2100 Also accepts self-pay patients.  °[facility] [address], [place] ° (336) 856-9996   °[facility] [address], [place] (336) 288-8857   °[facility] [address] [place] [address], [place] (336) 299-7000   °Veita Bland [address], [place]  ° (336) 373-1557 Only accepts [place] Access Medicaid patients after they have their name applied to their card.  ° °Self-Pay (no insurance) in [place]: ° °Organization         Address  Phone   Notes  °Sickle Cell Patients, [facility] [address], [place] (336) 832-1970   °[facility] [address], [place] (336) 832-4400   °[facility] ° [address], [place] (336) 992-4800   °[facility]/Dr. Osei-Bonsu ° [address] [place] [address], [place] or [address], [place] (336) 841-8500 Phone number for both [place] and [place] locations is the same.  °[facility] [address], [place] (336) 299-0000   °[facility] [address] [place] [address], [place] or [address] (336) 852-7530 °(336) 878-2260   °[facility] [address], [place] (336) 350-1642, phone; (336) 294-5005, fax Sees patients 1st and 3rd Saturday of every month.  Must not qualify for public or private insurance (i.e. Medicaid, Medicare, [place] Health Choice, Veterans' Benefits) • Household income should be no more than 200% of the poverty level •The clinic cannot treat you if you are pregnant or think you are pregnant • Sexually transmitted diseases are not treated at the clinic.  ° ° °Dental Care: °Organization         Address  Phone  Notes  °[facility] [facility] [address],  (336) 641-6152 Accepts children up to age 21 who are enrolled in Medicaid or [place] Health Choice; pregnant women with a Medicaid card; and children who have applied for Medicaid or [place] Health Choice, but were declined, whose  parents can pay a reduced fee at time of service.  °[facility] [place]  [address], [place] (336) 641-7733 Accepts children up to age 21 who are enrolled in Medicaid or [place] Health Choice; pregnant women with a Medicaid card; and children who have applied for Medicaid   or [place] Health Choice, but were declined, whose parents can pay a reduced fee at time of service.  °[facility] ° [address], [place] (336) 641-4533 Patients are seen by appointment only. Walk-ins are not accepted. [facility] will see patients 18 years of age and older. °Monday - Tuesday (8am-5pm) °Most Wednesdays (8:30-5pm) °$30 per visit, cash only  °[facility] ° [address], [place] (336) 641-4533 Patients are seen by appointment only. Walk-ins are not accepted. [facility] will see patients 18 years of age and older. °One Wednesday Evening (Monthly: Volunteer Based).  $30 per visit, cash only  °[facility]  (919) 537-3737 for adults; Children under age 4, call Graduate Pediatric Dentistry at ([phone]) [phone]. Children aged 4-14, please call (919) [phone] to request a pediatric application. ° Dental services are provided in all areas of dental care including fillings, crowns and bridges, complete and partial dentures, implants, gum treatment, root canals, and extractions. Preventive care is also provided. Treatment is provided to both adults and children. °Patients are selected via a lottery and there is often a waiting list. °  °[facility] [address], °[place] ° (336) 763-8833 [email or web address] °  °[facility] [address], [place], [place] (336)723-1848, Ext. 123 Second and Fourth Thursday of each month, opens at 6:30 AM; Clinic ends at 9 AM.  Patients are seen on a first-come first-served basis, and a limited number are seen during each clinic.  ° °[facility] °  [address], [place], [place] (336) 723-7904   Eligibility Requirements °You must have lived in [place], or [place] counties for at least the last three months. °  You cannot be eligible for state or federal sponsored healthcare insurance, including Veterans Administration, Medicaid, or Medicare. °  You generally cannot be eligible for healthcare insurance through your employer.  °  How to apply: °Eligibility screenings are held every Tuesday and Wednesday afternoon from 1:00 pm until 4:00 pm. You do not need an appointment for the interview!  °[facility] [address], [place], [place] 336-631-2330   °[facility]  336-342-8273   °[facility]  336-703-3100   °[facility]  336-570-6415   ° °Behavioral Health Resources in the Community: °Intensive Outpatient Programs °Organization         Address  Phone  Notes  °[facility] [address], [place], [place] 336-878-6098   °[facility] [address], [place], [place] 336-832-9800   °[facility] [address], [place], [place] ° 336-882-2125   °[facility] [address],  °[place], [place] 1-800-853-5163 or 336-641-4981   °Substance Abuse Resources °Organization         Address  Phone  Notes  °[facility]  336-882-2125   °[facility]  336-784-9470   °[facility]  336-285-9073   °[facility]  336-845-3988   °Residential & Outpatient Substance Abuse Program  1-800-659-3381   °Psychological Services °Organization         Address  Phone  Notes  °[facility]  336- 832-9600   °[facility]  336- 378-7881   °[facility] [address], [place] 1-800-853-5163 or 336-641-4981   ° °Mobile Crisis Teams °Organization         Address  Phone    Notes  °[facility], Mobile Crisis Care Unit  1-877-626-1772     °Assertive °[facility] ° [address] [place], [place] 336-834-9664   °Sharon DeEsch [address] °[place] 336-554-5454   ° °Self-Help/Support Groups °Organization         Address  Phone             Notes  °[facility] - variety of support groups  336- 373-1402 Call for more information  °Narcotics Anonymous (NA), [facility] [address], °[place]  2 meetings at this location  ° °Residential Treatment Programs °Organization         Address  Phone  Notes  °[facility] [address],    °[place]  1-866-801-8205   °[facility] ° [address], [place], [place] 704-293-8524   °[facility] [address], [place] 336-845-3988 Admissions: 8am-3pm M-F  °[facility] [address]    °[place], [place] 336-841-1104   °[facility] [address], [place], [place] 336-379-7146   °[facility] [address]  °[place], [place] 336-285-9073   °[facility] - Intensive Outpatient [address], [place], [place] 336-852-3033   °[facility] [address]  °[place], [place] 1-877-615-2722 or 336-784-9470   °[facility] [address] [place], [place] 336-227-7417 Accepts Medicaid  °[facility] [address]  °[place] 1-800-659-3381 Substance Abuse/Addiction Treatment  ° °[place] Behavioral Health Resources °Organization         Address  Phone  Notes  °[facility]  (888) 581-9988   °Julie Brannon, PhD [address] [place], [place]   (336) 349-5553 or (336) 951-0000   °[facility]   [address] °[place], [place] (336) 349-4454   °[facility] [address], [place], [place] (336) 342-8316 Insurance/Medicaid/sponsorship through [facility]  °[facility] [address]                                     [place], [place] (336) 342-8316 Therapy/tele-psych/case  °[facility] [address]  ° [place], [place] (336) 349-2233    °Dr. Arfeen  (336) 349-4544   °[facility] [place]  [employer] [place] [facility] 1) [address], [place] °2) [address], [place] °3)  [address], [place] (336) 349-3220 °(336) 342-7768 ° °(336) 342-8140   °[place] [facility] (336) 342-1394 or (336) 342-3537 (After Hours)    ° ° ° ° °

## 2015-03-11 ENCOUNTER — Encounter (HOSPITAL_COMMUNITY): Payer: Self-pay | Admitting: Emergency Medicine

## 2015-03-11 ENCOUNTER — Emergency Department (HOSPITAL_COMMUNITY)
Admission: EM | Admit: 2015-03-11 | Discharge: 2015-03-11 | Disposition: A | Discharge status: Home / Self Care | Payer: Medicaid Other | Attending: Emergency Medicine | Admitting: Emergency Medicine

## 2015-03-11 DIAGNOSIS — M5432 Sciatica, left side: Secondary | ICD-10-CM | POA: Insufficient documentation

## 2015-03-11 DIAGNOSIS — E119 Type 2 diabetes mellitus without complications: Secondary | ICD-10-CM | POA: Insufficient documentation

## 2015-03-11 DIAGNOSIS — I10 Essential (primary) hypertension: Secondary | ICD-10-CM | POA: Diagnosis not present

## 2015-03-11 DIAGNOSIS — Z79899 Other long term (current) drug therapy: Secondary | ICD-10-CM | POA: Diagnosis not present

## 2015-03-11 DIAGNOSIS — M545 Low back pain: Secondary | ICD-10-CM | POA: Diagnosis present

## 2015-03-11 DIAGNOSIS — Z720 Tobacco use: Secondary | ICD-10-CM | POA: Diagnosis not present

## 2015-03-11 MED ORDER — METHOCARBAMOL 500 MG PO TABS
500.0000 mg | ORAL_TABLET | Freq: Once | ORAL | Status: AC
  Administered 2015-03-11: 500 mg via ORAL
  Filled 2015-03-11: qty 1

## 2015-03-11 MED ORDER — OXYCODONE-ACETAMINOPHEN 5-325 MG PO TABS: 1.0000 | ORAL_TABLET | ORAL | Status: DC | PRN

## 2015-03-11 MED ORDER — PREDNISONE 10 MG PO TABS: ORAL_TABLET | ORAL | Status: DC

## 2015-03-11 MED ORDER — OXYCODONE-ACETAMINOPHEN 5-325 MG PO TABS
2.0000 | ORAL_TABLET | Freq: Once | ORAL | Status: AC
  Administered 2015-03-11: 2 via ORAL
  Filled 2015-03-11: qty 2

## 2015-03-11 NOTE — ED Notes (Addendum)
Pt reports lower back pain that is worse with movement and radiates to left hip for last several weeks. Pt denies any known injury. nad noted.

## 2015-03-11 NOTE — Discharge Instructions (Signed)
Back Pain, Adult °Back pain is very common. The pain often gets better over time. The cause of back pain is usually not dangerous. Most people can learn to manage their back pain on their own.  °HOME CARE  °· Stay active. Start with short walks on flat ground if you can. Try to walk farther each day. °· Do not sit, drive, or stand in one place for more than 30 minutes. Do not stay in bed. °· Do not avoid exercise or work. Activity can help your back heal faster. °· Be careful when you bend or lift an object. Bend at your knees, keep the object close to you, and do not twist. °· Sleep on a firm mattress. Lie on your side, and bend your knees. If you lie on your back, put a pillow under your knees. °· Only take medicines as told by your doctor. °· Put ice on the injured area. °¨ Put ice in a plastic bag. °¨ Place a towel between your skin and the bag. °¨ Leave the ice on for 15-20 minutes, 03-04 times a day for the first 2 to 3 days. After that, you can switch between ice and heat packs. °· Ask your doctor about back exercises or massage. °· Avoid feeling anxious or stressed. Find good ways to deal with stress, such as exercise. °GET HELP RIGHT AWAY IF:  °· Your pain does not go away with rest or medicine. °· Your pain does not go away in 1 week. °· You have new problems. °· You do not feel well. °· The pain spreads into your legs. °· You cannot control when you poop (bowel movement) or pee (urinate). °· Your arms or legs feel weak or lose feeling (numbness). °· You feel sick to your stomach (nauseous) or throw up (vomit). °· You have belly (abdominal) pain. °· You feel like you may pass out (faint). °MAKE SURE YOU:  °· Understand these instructions. °· Will watch your condition. °· Will get help right away if you are not doing well or get worse. °Document Released: 03/12/2008 Document Revised: 12/17/2011 Document Reviewed: 01/26/2014 °ExitCare® Patient Information ©2015 [employer]. This information is not intended  to replace advice given to you by your health care provider. Make sure you discuss any questions you have with your health care provider. ° ° °Emergency Department Resource Guide °1) Find a Doctor and Pay Out of Pocket °Although you won't have to find out who is covered by your insurance plan, it is a good idea to ask around and get recommendations. You will then need to call the office and see if the doctor you have chosen will accept you as a new patient and what types of options they offer for patients who are self-pay. Some doctors offer discounts or will set up payment plans for their patients who do not have insurance, but you will need to ask so you aren't surprised when you get to your appointment. ° °2) Contact Your Local Health Department °Not all health departments have doctors that can see patients for sick visits, but many do, so it is worth a call to see if yours does. If you don't know where your local health department is, you can check in your phone book. The CDC also has a tool to help you locate your state's health department, and many state websites also have listings of all of their local health departments. ° °3) Find a Walk-in Clinic °If your illness is not likely to be   very severe or complicated, you may want to try a walk in clinic. These are popping up all over the country in pharmacies, drugstores, and shopping centers. They're usually staffed by nurse practitioners or physician assistants that have been trained to treat common illnesses and complaints. They're usually fairly quick and inexpensive. However, if you have serious medical issues or chronic medical problems, these are probably not your best option. ° °No Primary Care Doctor: °- Call [facility] at  832-8000 - they can help you locate a primary care doctor that  accepts your insurance, provides certain services, etc. °- Physician Referral Service- 1-800-533-3463 ° °Chronic Pain Problems: °Organization         Address  Phone    Notes  °[facility]  (336) 297-2271 Patients need to be referred by their primary care doctor.  ° °Medication Assistance: °Organization         Address  Phone   Notes  °[place] [facility] [address] °[place], [place] [address] (336) 641-8030 --Must be a resident of [place] °-- Must have NO insurance coverage whatsoever (no Medicaid/ Medicare, etc.) °-- The pt. MUST have a primary care doctor that directs their care regularly and follows them in the community °  °[facility]  (866) 331-1348   °[employer]  (888) 892-1162   ° °Agencies that provide inexpensive medical care: °Organization         Address  Phone   Notes  °[facility]  (336) 832-8035   °[facility]    (336) 832-7272   °[facility] [address] °[place], [place] [address] (336) 832-4777   °[facility] [address], °[place] (336) 271-4999   °[facility]    (336) 373-0678   °[facility]    (336) 272-1050   °[facility] ° [address], [place] Phone:  (336) 832-4444, Fax:  (336) 832-4440 Hours of Operation:  9 am - 6 pm, M-F.  Also accepts Medicaid/Medicare and self-pay.  °[facility] ° [address], [place] Phone: (336) 832-3150, Fax: (336) 832-3151. Hours of Operation:  8:30 am - 5:30 pm, M-F.  Also accepts Medicaid and self-pay.  °[facility] [place] [address], [place] Phone: (336) 878-6027   °[facility] [address], [place], [place] (336)723-1848, Ext. 123 Mondays & Thursdays: 7-9 AM.  First 15 patients are seen on a first come, first serve basis. °  ° °Medicaid-accepting [place] Providers: ° °Organization         Address  Phone   Notes  °[facility] [address], [place] (336) 641-2100 Also accepts self-pay patients.  °[facility]  [address], [place] ° (336) 856-9996   °[facility] [address], [place] (336) 288-8857   °[facility] [address] [place] [address], [place] (336) 299-7000   °Veita Bland [address], [place]  ° (336) 373-1557 Only accepts [place] Access Medicaid patients after they have their name applied to their card.  ° °Self-Pay (no insurance) in [place]: ° °Organization         Address  Phone   Notes  °Sickle Cell Patients, [facility] [address], [place] (336) 832-1970   °[facility]   [address], [place] (336) 832-4400   °[facility] ° [address], [place] (336) 992-4800   °[facility]/Dr. Osei-Bonsu ° [address] [place] [address], [place] or [address], [place] (336) 841-8500 Phone number for both [place] and [place] locations is the same.  °[facility] [address], [place] (336) 299-0000   °[facility] [address] [place] [address], [place] or [address] (336) 852-7530 °(336) 878-2260   °[facility] [address], [place] (336) 350-1642, phone; (336) 294-5005, fax Sees patients 1st and 3rd Saturday of every month.  Must not qualify for public or private insurance (i.e. Medicaid, Medicare, [place] Health Choice, Veterans' Benefits) • Household income should be no more than 200% of the poverty level •The clinic cannot treat you if you are pregnant or think you are pregnant • Sexually transmitted diseases are not treated at the clinic.  ° ° °Dental Care: °Organization         Address  Phone  Notes  °[facility] [address], [place] (336) 641-6152 Accepts children up to age 21 who are enrolled in Medicaid or [place] Health Choice; pregnant women with a Medicaid card; and children who have  applied for Medicaid or [place] Health Choice, but were declined, whose parents can pay a reduced fee at time of service.  °[facility] [place]  [address], [place] (336) 641-7733 Accepts children up to age 21 who are enrolled in Medicaid or [place] Health Choice; pregnant women with a Medicaid card; and children who have applied for Medicaid or [place] Health Choice, but were declined, whose parents can pay a reduced fee at time of service.  °[facility] ° [address], [place] (336) 641-4533 Patients are seen by appointment only. Walk-ins are not accepted. [facility] will see patients 18 years of age and older. °Monday - Tuesday (8am-5pm) °Most Wednesdays (8:30-5pm) °$30 per visit, cash only  °[facility] ° [address], [place] (336) 641-4533 Patients are seen by appointment only. Walk-ins are not accepted. [facility] will see patients 18 years of age and older. °One Wednesday Evening (Monthly: Volunteer Based).  $30 per visit, cash only  °[facility]  (919) 537-3737 for adults; Children under age 4, call Graduate Pediatric Dentistry at (919) [phone]. Children aged 4-14, please call (919) [phone] to request a pediatric application. ° Dental services are provided in all areas of dental care including fillings, crowns and bridges, complete and partial dentures, implants, gum treatment, root canals, and extractions. Preventive care is also provided. Treatment is provided to both adults and children. °Patients are selected via a lottery and there is often a waiting list. °  °[facility] [address], ° ° (336) 763-8833 [email or web address] °  °[facility] [address], [place], [place] (336)723-1848, Ext. 123 Second and Fourth Thursday of each month, opens at 6:30 AM; Clinic ends at 9 AM.  Patients are seen on a first-come first-served basis, and a  limited number are seen during each clinic.  ° °[facility] ° [address], [place],  (336) 723-7904   Eligibility Requirements °You must have lived in [place], [place], or [place] counties for at least the last three months. °    You cannot be eligible for state or federal sponsored healthcare insurance, including Veterans Administration, Medicaid, or Medicare. °  You generally cannot be eligible for healthcare insurance through your employer.  °  How to apply: °Eligibility screenings are held every Tuesday and Wednesday afternoon from 1:00 pm until 4:00 pm. You do not need an appointment for the interview!  °[facility] [address], [place], [place] 336-631-2330   °[facility]  336-342-8273   °[facility]  336-703-3100   °[facility]  336-570-6415   ° °Behavioral Health Resources in the Community: °Intensive Outpatient Programs °Organization         Address  Phone  Notes  °[facility] [address], [place], [place] 336-878-6098   °[facility] [address], [place], [place] 336-832-9800   °[facility] [address], [place], [place] ° 336-882-2125   °[facility] [address],  °[place], [place] 1-800-853-5163 or 336-641-4981   °Substance Abuse Resources °Organization         Address  Phone  Notes  °[facility]  336-882-2125   °[facility]  336-784-9470   °[facility]  336-285-9073   °[facility]  336-845-3988   °Residential & Outpatient Substance Abuse Program  1-800-659-3381   °Psychological Services °Organization         Address  Phone  Notes  °[facility]  336- 832-9600   °[facility]  336- 378-7881   °[facility] [address], [place] 1-800-853-5163 or 336-641-4981   ° °Mobile Crisis Teams °Organization          Address  Phone  Notes  °[facility], Mobile Crisis Care Unit  1-877-626-1772   °Assertive °[facility] ° [address] [place], [place] 336-834-9664   °Sharon DeEsch [address] °[place] 336-554-5454   ° °Self-Help/Support Groups °Organization         Address  Phone             Notes  °[facility] - variety of support groups  336- 373-1402 Call for more information  °Narcotics Anonymous (NA), [facility] [address], °[place]  2 meetings at this location  ° °Residential Treatment Programs °Organization         Address  Phone  Notes  °[facility] [address],    °[place]  1-866-801-8205   °[facility] ° [address], [place], [place] 704-293-8524   °[facility] [address], [place] 336-845-3988 Admissions: 8am-3pm M-F  °[facility] [address]    °[place], [place] 336-841-1104   °[facility] [address], [place], [place] 336-379-7146   °[facility] [address]  °[place], [place] 336-285-9073   °[facility] - Intensive Outpatient [address], [place], [place] 336-852-3033   °[facility] [address]  °[place], [place] 1-877-615-2722 or 336-784-9470   °[facility] [address] [place], [place] 336-227-7417 Accepts Medicaid  °[facility] [address]  °[place] 1-800-659-3381 Substance Abuse/Addiction Treatment  ° °[place] Behavioral Health Resources °Organization         Address  Phone  Notes  °[facility]  (888) 581-9988   °Julie Brannon, PhD [address] [place], [place]   (336) 349-5553 or (336) 951-0000   °[facility]   [address] °[place], [place] (336) 349-4454   °[facility] [address], [place], [place] (336) 342-8316 Insurance/Medicaid/sponsorship  through [facility]  °[facility] [address]                                    [place], [place] (336) 342-8316 Therapy/tele-psych/case  °[facility] [address]  ° [place], [place] (336) 349-2233    °Dr. Arfeen  (336) 349-4544   °[facility] [place]  [employer] [place] [facility] 1) [address], [place] °2) [address], [place] °3)  [address], [place] (336) 349-3220 °(336) 342-7768 ° °(336) 342-8140   °[place] [facility] (336) 342-1394 or (336) 342-3537 (After Hours)    ° ° ° ° °

## 2015-03-11 NOTE — ED Provider Notes (Signed)
CSN: [id number]     Arrival date & time 03/11/15  1411 History   First MD Initiated Contact with Patient 03/11/15 1419     Chief Complaint  Patient presents with  . Back Pain     (Consider location/radiation/quality/duration/timing/severity/associated sxs/prior Treatment) HPI  Miguel Weber is a 34 y.o. male who presents to the Emergency Department complaining of worse of his chronic low back pain.  Pain has been worsening for 3 weeks.  He describes a sharp, burning pain to the left lower back that radiates into his left leg.  Pain is worse with weight bearing and movement.  He denies urine or bowel changes, abd pain, numbness or weakness of the lower extremities.  He also states that his PMD has recently moved is practice to another state and he now is without a primary doctor    Past Medical History  Diagnosis Date  . Diabetes mellitus   . Hypertension   . Shoulder injury    Past Surgical History  Procedure Laterality Date  . Cholecystectomy     History reviewed. No pertinent family history. History  Substance Use Topics  . Smoking status: Current Every Day Smoker -- 2.00 packs/day for 22 years    Types: Cigarettes  . Smokeless tobacco: Never Used  . Alcohol Use: No    Review of Systems  Constitutional: Negative for fever.  Respiratory: Negative for shortness of breath.   Gastrointestinal: Negative for vomiting, abdominal pain and constipation.  Genitourinary: Negative for dysuria, hematuria, flank pain, decreased urine volume and difficulty urinating.  Musculoskeletal: Positive for back pain. Negative for joint swelling.  Skin: Negative for rash.  Neurological: Negative for weakness and numbness.  All other systems reviewed and are negative.     Allergies  Tramadol; Aspirin; and Motrin  Home Medications   Prior to Admission medications   Medication Sig Start Date End Date Taking? Authorizing Provider  amLODipine (NORVASC) 5 MG tablet Take 5 mg by mouth daily.    Yes Historical Provider, MD  diazepam (VALIUM) 10 MG tablet Take 1 tablet (10 mg total) by mouth every 6 (six) hours as needed for anxiety or muscle spasms. 10/02/14  Yes Glynn OctaveStephen Rancour, MD  lisinopril-hydrochlorothiazide (PRINZIDE,ZESTORETIC) 20-12.5 MG per tablet Take 1 tablet by mouth daily.   Yes Historical Provider, MD  metFORMIN (GLUCOPHAGE) 500 MG tablet Take 1,000 mg by mouth at bedtime.    Yes Historical Provider, MD  famotidine (PEPCID) 20 MG tablet Take 1 tablet (20 mg total) by mouth 2 (two) times daily. Patient not taking: Reported on 10/02/2014 06/25/14   Eber HongBrian Miller, MD  ondansetron (ZOFRAN) 4 MG tablet Take 1 tablet (4 mg total) by mouth every 8 (eight) hours as needed for nausea or vomiting. Patient not taking: Reported on 10/02/2014 06/25/14   Eber HongBrian Miller, MD   BP 146/95 mmHg  Pulse 80  Temp(Src) 98.1 F (36.7 C) (Oral)  Resp 20  Ht 6\' 5"  (1.956 m)  Wt 310 lb 12.8 oz (140.978 kg)  BMI 36.85 kg/m2  SpO2 96% Physical Exam  Constitutional: He is oriented to person, place, and time. He appears well-developed and well-nourished. No distress.  HENT:  Head: Normocephalic and atraumatic.  Neck: Normal range of motion. Neck supple.  Cardiovascular: Normal rate, regular rhythm, normal heart sounds and intact distal pulses.   No murmur heard. Pulmonary/Chest: Effort normal and breath sounds normal. No respiratory distress.  Abdominal: Soft. He exhibits no distension. There is no tenderness.  Musculoskeletal: He exhibits tenderness. He exhibits  no edema.       Lumbar back: He exhibits tenderness and pain. He exhibits normal range of motion, no swelling, no deformity, no laceration and normal pulse.  ttp of the left lumbar spine and paraspinal muscles.  DP pulses are brisk and symmetrical.  Distal sensation intact.  Hip Flexors/Extensors are intact.  Pt has 5/5 strength against resistance of bilateral lower extremities.     Neurological: He is alert and oriented to person, place,  and time. He has normal strength. No sensory deficit. He exhibits normal muscle tone. Coordination and gait normal.  Reflex Scores:      Patellar reflexes are 2+ on the right side and 2+ on the left side.      Achilles reflexes are 2+ on the right side and 2+ on the left side. Skin: Skin is warm and dry. No rash noted.  Nursing note and vitals reviewed.   ED Course  Procedures (including critical care time) Labs Review Labs Reviewed - No data to display  Imaging Review No results found.   EKG Interpretation None      MDM   Final diagnoses:  Sciatica, left   Pt is ambulatory, no focal neuro deficits.  No concerning sx's for emergent neuro process. No indication for imaging at this time.  Pt likely has acute on chronic pain.  Vitals stable.  Pt stable for d/c and given resource guide and agree to arrange PMD care.      Pauline Aus, PA-C 03/12/15 2328  Rolland Porter, MD 03/25/15 [phone]

## 2015-03-11 NOTE — ED Notes (Signed)
Pain low back for 3 weeks, with radiation to lt hip. No known injury.

## 2015-03-19 IMAGING — CR DG CERVICAL SPINE COMPLETE 4+V
7 series · 7 of 7 positions shown · non-contrast
Comparison: 09/01/2012

CLINICAL DATA: Fall, neck pain

CERVICAL SPINE - COMPLETE 4+ VIEW

[view not recorded (1 of 7)]
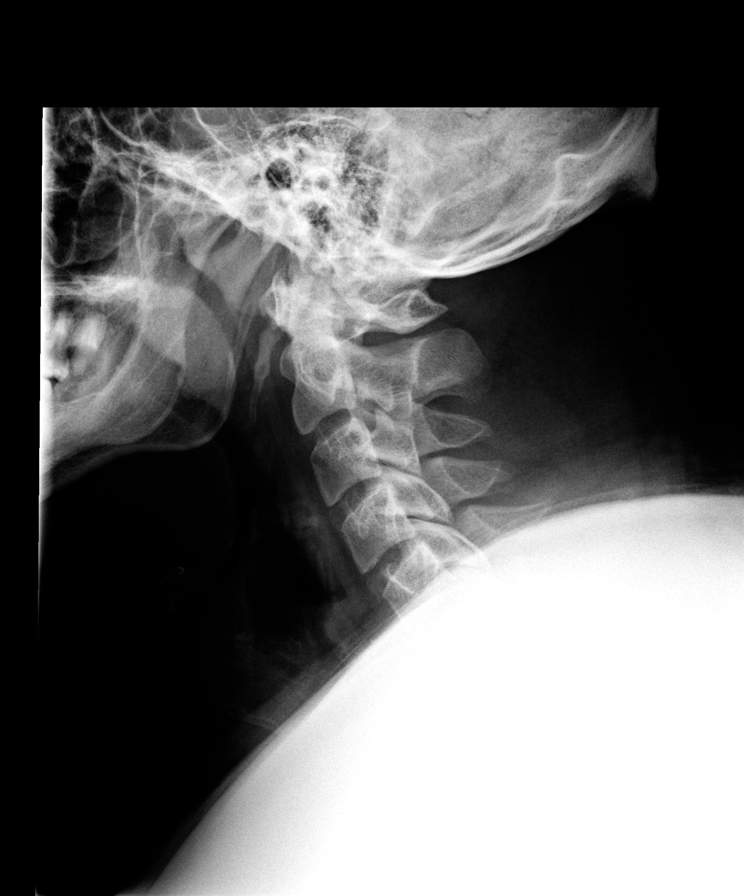

[view not recorded (2 of 7)]
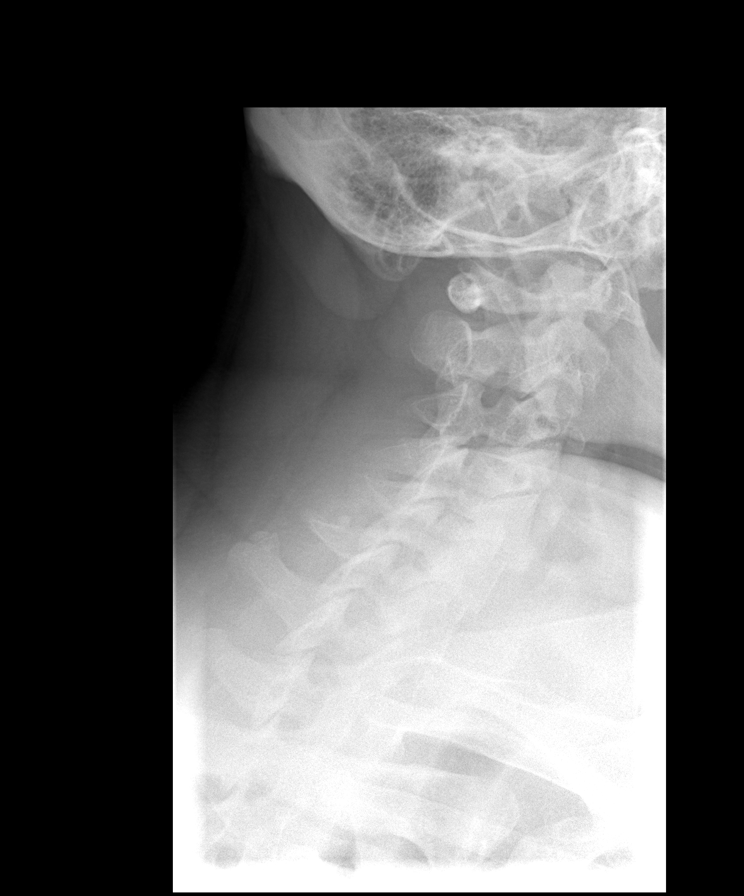

[view not recorded (3 of 7)]
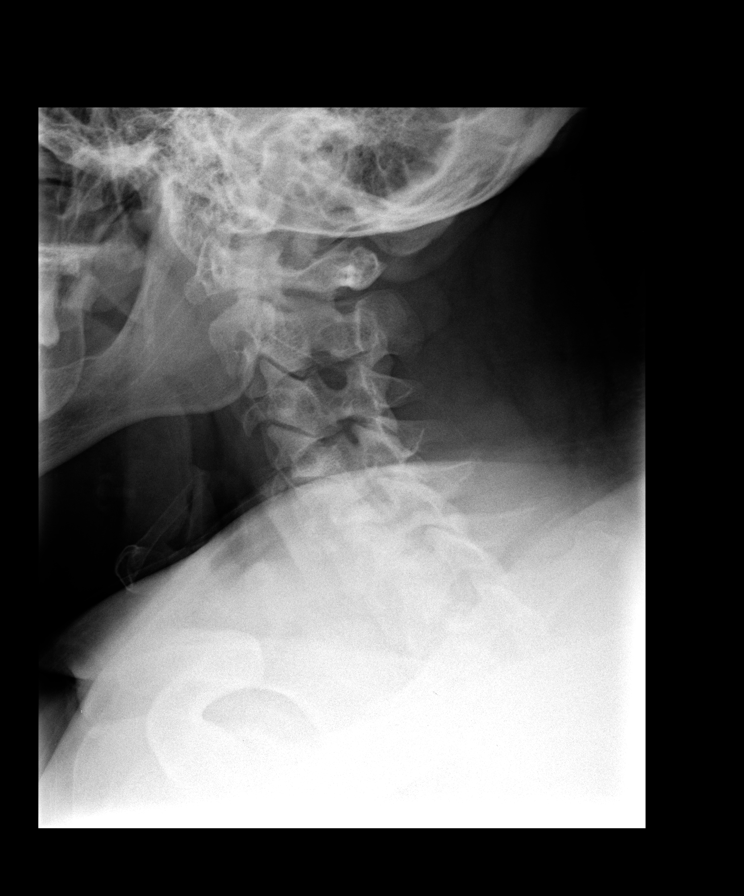

[view not recorded (4 of 7)]
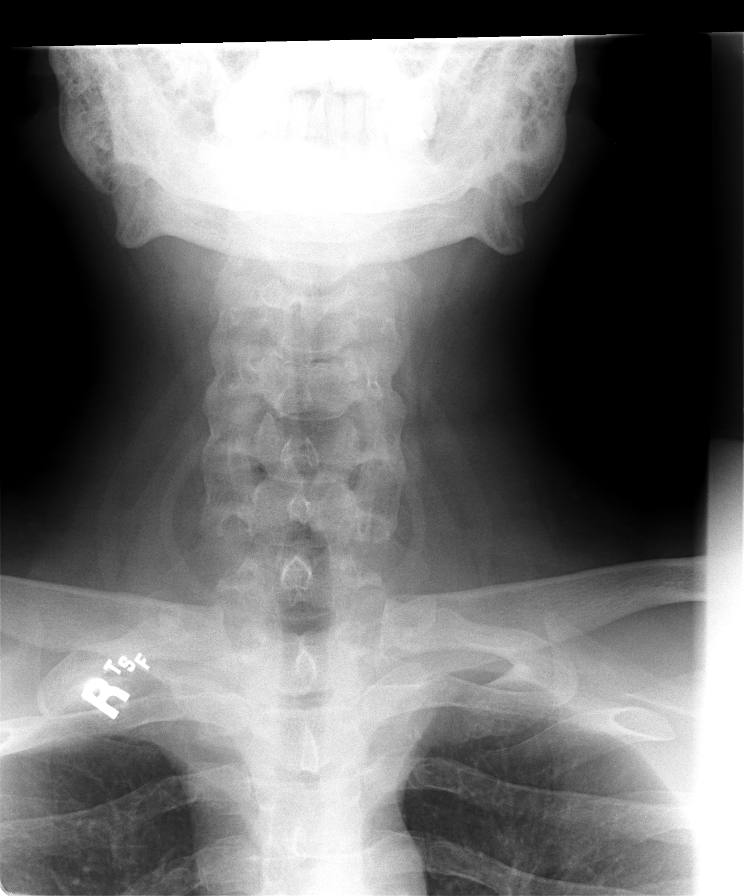

[view not recorded (5 of 7)]
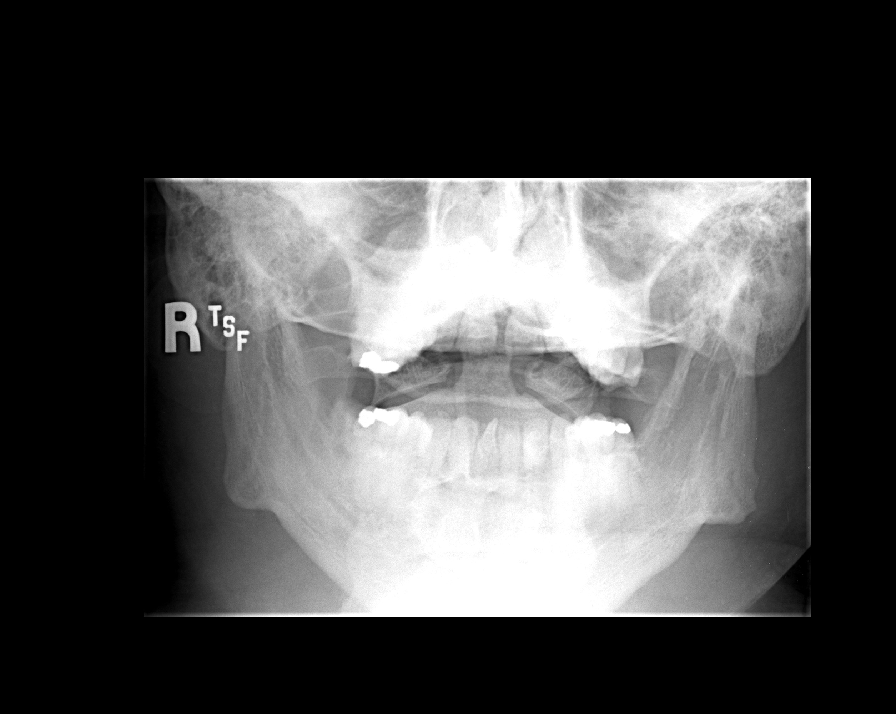

[view not recorded (6 of 7)]
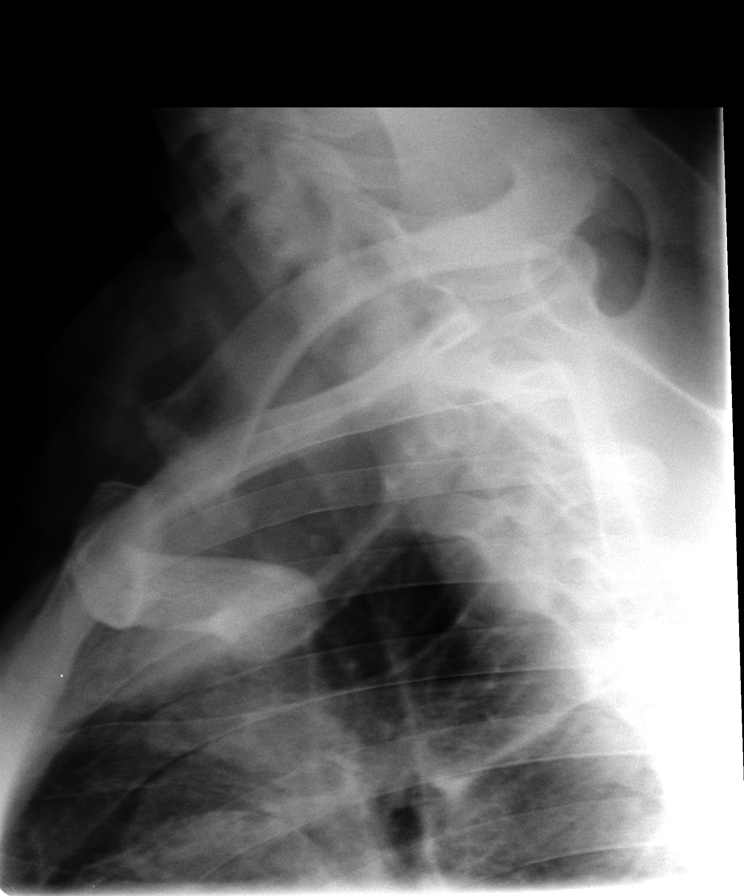

[view not recorded (7 of 7)]
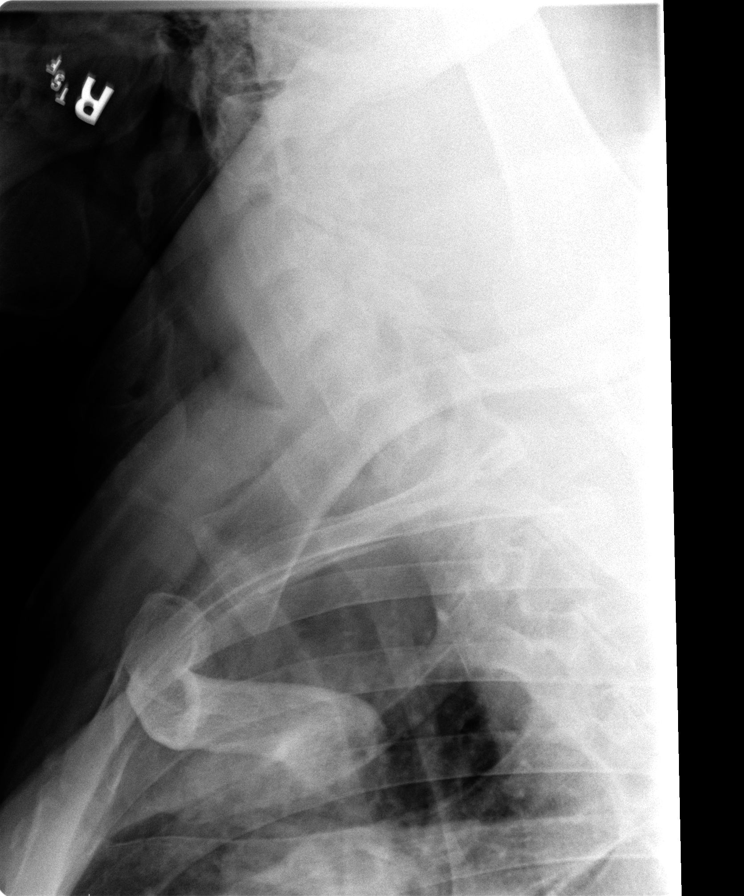

[7 of 7 positions shown; findings below may reference images not displayed]

FINDINGS: Limited exam because of patient body habitus.  Limited
assessment of C6-C7.  No gross malalignment demonstrated.
Preserved vertebral body heights and disc spaces.  Intact odontoid.
Facets appear aligned.  Lung apices clear.  Intact odontoid.
IMPRESSION: Limited exam but no gross acute abnormality

## 2015-04-21 ENCOUNTER — Encounter (HOSPITAL_COMMUNITY): Payer: Self-pay | Admitting: *Deleted

## 2015-04-21 ENCOUNTER — Emergency Department (HOSPITAL_COMMUNITY): Payer: Medicaid Other

## 2015-04-21 ENCOUNTER — Emergency Department (HOSPITAL_COMMUNITY)
Admission: EM | Admit: 2015-04-21 | Discharge: 2015-04-21 | Disposition: A | Discharge status: Home / Self Care | Payer: Medicaid Other | Attending: Emergency Medicine | Admitting: Emergency Medicine

## 2015-04-21 DIAGNOSIS — Z720 Tobacco use: Secondary | ICD-10-CM | POA: Diagnosis not present

## 2015-04-21 DIAGNOSIS — S79912A Unspecified injury of left hip, initial encounter: Secondary | ICD-10-CM | POA: Diagnosis not present

## 2015-04-21 DIAGNOSIS — Y9289 Other specified places as the place of occurrence of the external cause: Secondary | ICD-10-CM | POA: Diagnosis not present

## 2015-04-21 DIAGNOSIS — Z79899 Other long term (current) drug therapy: Secondary | ICD-10-CM | POA: Diagnosis not present

## 2015-04-21 DIAGNOSIS — Y998 Other external cause status: Secondary | ICD-10-CM | POA: Diagnosis not present

## 2015-04-21 DIAGNOSIS — E119 Type 2 diabetes mellitus without complications: Secondary | ICD-10-CM | POA: Diagnosis not present

## 2015-04-21 DIAGNOSIS — Y939 Activity, unspecified: Secondary | ICD-10-CM | POA: Diagnosis not present

## 2015-04-21 DIAGNOSIS — I10 Essential (primary) hypertension: Secondary | ICD-10-CM | POA: Insufficient documentation

## 2015-04-21 DIAGNOSIS — W1840XA Slipping, tripping and stumbling without falling, unspecified, initial encounter: Secondary | ICD-10-CM | POA: Diagnosis not present

## 2015-04-21 DIAGNOSIS — S79922A Unspecified injury of left thigh, initial encounter: Secondary | ICD-10-CM | POA: Diagnosis present

## 2015-04-21 DIAGNOSIS — S3991XA Unspecified injury of abdomen, initial encounter: Secondary | ICD-10-CM

## 2015-04-21 MED ORDER — CYCLOBENZAPRINE HCL 10 MG PO TABS: 10.0000 mg | ORAL_TABLET | Freq: Two times a day (BID) | ORAL | Status: DC | PRN

## 2015-04-21 MED ORDER — OXYCODONE-ACETAMINOPHEN 5-325 MG PO TABS
1.0000 | ORAL_TABLET | Freq: Once | ORAL | Status: AC
  Administered 2015-04-21: 1 via ORAL
  Filled 2015-04-21: qty 1

## 2015-04-21 MED ORDER — OXYCODONE-ACETAMINOPHEN 5-325 MG PO TABS: 1.0000 | ORAL_TABLET | Freq: Four times a day (QID) | ORAL | Status: DC | PRN

## 2015-04-21 NOTE — ED Provider Notes (Signed)
CSN: [id number]     Arrival date & time 04/21/15  1506 History   First MD Initiated Contact with Patient 04/21/15 1713     Chief Complaint  Patient presents with  . Groin Pain     (Consider location/radiation/quality/duration/timing/severity/associated sxs/prior Treatment) Patient is a 34 y.o. male presenting with groin pain. The history is provided by the patient.  Groin Pain This is a new problem. The current episode started yesterday. The problem occurs constantly. The problem has been unchanged. He has tried heat and oral narcotics for the symptoms. The treatment provided no relief.   Miguel Weber is a 34 y.o. male who presents to the ED with left groin pain that started last night after he slipped on a wet step and did a split. He has been taking his oxycodone that he had left over from a whle back without relief.   Past Medical History  Diagnosis Date  . Diabetes mellitus   . Hypertension   . Shoulder injury    Past Surgical History  Procedure Laterality Date  . Cholecystectomy     History reviewed. No pertinent family history. History  Substance Use Topics  . Smoking status: Current Every Day Smoker -- 1.00 packs/day for 22 years    Types: Cigarettes  . Smokeless tobacco: Never Used  . Alcohol Use: No    Review of Systems Negative except as stated in HPI   Allergies  Tramadol; Aspirin; and Motrin  Home Medications   Prior to Admission medications   Medication Sig Start Date End Date Taking? Authorizing Provider  amLODipine (NORVASC) 5 MG tablet Take 5 mg by mouth daily.    Historical Provider, MD  cyclobenzaprine (FLEXERIL) 10 MG tablet Take 1 tablet (10 mg total) by mouth 2 (two) times daily as needed for muscle spasms. 04/21/15   Karem Farha Orlene OchM Trachelle Low, NP  diazepam (VALIUM) 10 MG tablet Take 1 tablet (10 mg total) by mouth every 6 (six) hours as needed for anxiety or muscle spasms. 10/02/14   Glynn OctaveStephen Rancour, MD  lisinopril-hydrochlorothiazide (PRINZIDE,ZESTORETIC)  20-12.5 MG per tablet Take 1 tablet by mouth daily.    Historical Provider, MD  metFORMIN (GLUCOPHAGE) 500 MG tablet Take 1,000 mg by mouth at bedtime.     Historical Provider, MD  oxyCODONE-acetaminophen (ROXICET) 5-325 MG per tablet Take 1 tablet by mouth every 6 (six) hours as needed for severe pain. 04/21/15   Tyrez Berrios Orlene OchM Marta Bouie, NP   BP 144/91 mmHg  Pulse 73  Temp(Src) 98.2 F (36.8 C) (Oral)  Resp 18  Ht 6\' 4"  (1.93 m)  Wt 320 lb (145.151 kg)  BMI 38.97 kg/m2  SpO2 98% Physical Exam  Constitutional: He is oriented to person, place, and time. He appears well-developed and well-nourished.  HENT:  Head: Normocephalic.  Eyes: EOM are normal.  Neck: Neck supple.  Cardiovascular: Normal rate.   Pulmonary/Chest: Effort normal.  Abdominal: Soft. There is no rebound, no guarding and no CVA tenderness.  Tender with palpation to the left inguinal area and left hip.   Musculoskeletal: Normal range of motion.       Left hip: He exhibits tenderness. He exhibits normal strength. Decreased range of motion: due to pain.  Pedal pulses 2+ bilateral, adequate circulation, good touch sensation.   Neurological: He is alert and oriented to person, place, and time. No cranial nerve deficit.  Skin: Skin is warm and dry.  Psychiatric: He has a normal mood and affect. His behavior is normal.  Nursing note and vitals reviewed.  ED Course  Procedures (including critical care time) I discussed this case with Dr. Estell Harpin. Will start muscle relaxants and pain medication.  Labs Review Labs Reviewed - No data to display  Imaging Review Dg Hip Unilat With Pelvis 2-3 Views Left  04/21/2015   CLINICAL DATA:  Slip and fall yesterday with left hip pain, initial encounter  EXAM: DG HIP (WITH OR WITHOUT PELVIS) 2-3V LEFT  COMPARISON:  None.  FINDINGS: Pelvic ring is intact. No acute fracture or dislocation is seen. No soft tissue changes are noted.  IMPRESSION: No acute abnormality noted.   Electronically Signed   By:  Alcide Clever M.D.   On: 04/21/2015 18:03   X-ray, ice, crutches, pain management.   MDM  34 y.o. male with left inguinal pain s/p injury. Stable for d/c without findings on x-ray. Discussed with the patient clinical and x-ray findings and plan of care. All questioned fully answered. He will call return any problems arise.      [address] [place], [place] 04/22/15 1610  Bethann Berkshire, MD 04/25/15 1356

## 2015-04-21 NOTE — ED Notes (Signed)
Slipped on wet step last night doing split. Has had L groin pain since which is getting worse. Has taken his rx Oxycondone which did not help, used heating pad but didn't help.

## 2015-07-10 ENCOUNTER — Emergency Department (HOSPITAL_COMMUNITY)
Admission: EM | Admit: 2015-07-10 | Discharge: 2015-07-10 | Disposition: A | Discharge status: Home / Self Care | Payer: Medicaid Other | Attending: Emergency Medicine | Admitting: Emergency Medicine

## 2015-07-10 ENCOUNTER — Encounter (HOSPITAL_COMMUNITY): Payer: Self-pay | Admitting: Emergency Medicine

## 2015-07-10 DIAGNOSIS — Z720 Tobacco use: Secondary | ICD-10-CM | POA: Diagnosis not present

## 2015-07-10 DIAGNOSIS — E119 Type 2 diabetes mellitus without complications: Secondary | ICD-10-CM | POA: Diagnosis not present

## 2015-07-10 DIAGNOSIS — Z79899 Other long term (current) drug therapy: Secondary | ICD-10-CM | POA: Diagnosis not present

## 2015-07-10 DIAGNOSIS — I10 Essential (primary) hypertension: Secondary | ICD-10-CM | POA: Insufficient documentation

## 2015-07-10 DIAGNOSIS — L02415 Cutaneous abscess of right lower limb: Secondary | ICD-10-CM | POA: Diagnosis not present

## 2015-07-10 MED ORDER — PROMETHAZINE HCL 12.5 MG PO TABS
12.5000 mg | ORAL_TABLET | Freq: Once | ORAL | Status: AC
  Administered 2015-07-10: 12.5 mg via ORAL
  Filled 2015-07-10: qty 1

## 2015-07-10 MED ORDER — ACETAMINOPHEN-CODEINE #3 300-30 MG PO TABS
2.0000 | ORAL_TABLET | Freq: Once | ORAL | Status: AC
  Administered 2015-07-10: 2 via ORAL
  Filled 2015-07-10: qty 2

## 2015-07-10 MED ORDER — DOXYCYCLINE HYCLATE 100 MG PO CAPS: 100.0000 mg | ORAL_CAPSULE | Freq: Two times a day (BID) | ORAL | Status: DC

## 2015-07-10 MED ORDER — POVIDONE-IODINE 10 % EX SOLN
CUTANEOUS | Status: AC
  Filled 2015-07-10: qty 118

## 2015-07-10 MED ORDER — DOXYCYCLINE HYCLATE 100 MG PO TABS
100.0000 mg | ORAL_TABLET | Freq: Once | ORAL | Status: AC
  Administered 2015-07-10: 100 mg via ORAL
  Filled 2015-07-10: qty 1

## 2015-07-10 MED ORDER — LIDOCAINE HCL (PF) 1 % IJ SOLN
INTRAMUSCULAR | Status: AC
  Filled 2015-07-10: qty 5

## 2015-07-10 MED ORDER — ACETAMINOPHEN-CODEINE #3 300-30 MG PO TABS: 1.0000 | ORAL_TABLET | Freq: Four times a day (QID) | ORAL | Status: DC | PRN

## 2015-07-10 NOTE — ED Notes (Addendum)
PT c/o abscess and swelling to right inner thigh. PT denies any drainage or injury to groin.

## 2015-07-10 NOTE — ED Provider Notes (Signed)
CSN: [id number]     Arrival date & time 07/10/15  1249 History  By signing my name below, I, Miguel Weber, attest that this documentation has been prepared under the direction and in the presence of Miguel Quale, PA-C. Electronically Signed: Ronney Weber, ED Scribe. 07/10/2015. 1:57 PM.   Chief Complaint  Patient presents with  . Abscess   Patient is a 34 y.o. male presenting with abscess. The history is provided by the patient. No language interpreter was used.  Abscess Location:  Ano-genital Ano-genital abscess location:  Groin Duration:  6 days Progression:  Unchanged Context: diabetes   Context: not immunosuppression   Relieved by:  None tried Worsened by:  Nothing tried Ineffective treatments:  Draining/squeezing Associated symptoms: no fever   Risk factors: hx of MRSA     HPI Comments: Miguel Weber is a 34 y.o. male with a history of DM and HTN, who presents to the Emergency Department complaining of a localized area of pain, redness, and swelling his right inner thigh that began about 6 days ago. He states the area initially appeared to be a whitehead, which he states he tried to squeeze/drain, but it did not alleviate his symptoms. He complains of constant, burning pain to the area. He denies fever or drainage from the area at this time. He denies any immunosuppression. 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    Past Medical History  Diagnosis Date  . Diabetes mellitus   . Hypertension   . Shoulder injury    Past Surgical History  Procedure Laterality Date  . Cholecystectomy     History reviewed. No pertinent family history. Social History  Substance Use Topics  . Smoking status: Current Every Day Smoker -- 1.00 packs/day for 22 years    Types: Cigarettes  . Smokeless tobacco: Never Used  . Alcohol Use: No    Review of Systems  Constitutional: Negative for fever.  Skin:       Positive for localized area of pain, redness, and swelling to skin of inner right thigh  and at groin  Allergic/Immunologic: Negative for immunocompromised state.  All other systems reviewed and are negative.   Allergies  Tramadol; Aspirin; and Motrin  Home Medications   Prior to Admission medications   Medication Sig Start Date End Date Taking? Authorizing Provider  amLODipine (NORVASC) 5 MG tablet Take 5 mg by mouth daily.    Historical Provider, MD  cyclobenzaprine (FLEXERIL) 10 MG tablet Take 1 tablet (10 mg total) by mouth 2 (two) times daily as needed for muscle spasms. 04/21/15   Hope Orlene Och, NP  diazepam (VALIUM) 10 MG tablet Take 1 tablet (10 mg total) by mouth every 6 (six) hours as needed for anxiety or muscle spasms. 10/02/14   Glynn Octave, MD  lisinopril-hydrochlorothiazide (PRINZIDE,ZESTORETIC) 20-12.5 MG per tablet Take 1 tablet by mouth daily.    Historical Provider, MD  metFORMIN (GLUCOPHAGE) 500 MG tablet Take 1,000 mg by mouth at bedtime.     Historical Provider, MD  oxyCODONE-acetaminophen (ROXICET) 5-325 MG per tablet Take 1 tablet by mouth every 6 (six) hours as needed for severe pain. 04/21/15   Hope Orlene Och, NP   BP 164/91 mmHg  Pulse 84  Temp(Src) 98.2 F (36.8 C) (Oral)  Resp 18  Ht  (1.956 m)  Wt 320 lb (145.151 kg)  BMI 37.94 kg/m2  SpO2 99% Physical Exam  Constitutional: He is oriented to person, place, and time. He appears well-developed and well-nourished. No distress.  HENT:  Head: Normocephalic and atraumatic.  Eyes: Conjunctivae and EOM are normal. Pupils are equal, round, and reactive to light.  Neck: Neck supple. No tracheal deviation present.  Cardiovascular: Normal rate, regular rhythm and normal heart sounds.   Pulmonary/Chest: Effort normal. No respiratory distress. He has no wheezes. He has no rales.  Musculoskeletal: Normal range of motion.  FROM of the right hip, knee, and ankle.   Neurological: He is alert and oriented to person, place, and time.  Skin: Skin is warm and dry.  1.4 cm raised, red, warm area of the  right inner thigh. No red streaks. The area is tender to palpation. No satellite lesions around the raised area. No palpable nodes in the inguinal area.   Psychiatric: He has a normal mood and affect. His behavior is normal.  Nursing note and vitals reviewed.   ED Course  Procedures (including critical care time)  DIAGNOSTIC STUDIES: Oxygen Saturation is 99% on RA, normal by my interpretation.    COORDINATION OF CARE: 1:36 PM - Discussed treatment plan with pt at bedside which includes Rx doxycycline, Tylenol-codeine, and warm tub soaks. Pt verbalized understanding and agreed to plan.   MDM  Vital signs reviewed. The blood pressure is slightly elevated at 164/91, otherwise the vital signs are well within normal limits. The abscess of the right inner thigh/leg is not a candidate for incision and drainage at this time. The patient will be asked to use warm tub soaks twice a day, he is also given a prescription for doxycycline 2 times daily. The patient requested a medication for assistance with his pain, as his physician is out of town at this time. Tylenol codeine 1 or 2 tablets every 6 hours #20 tablets was given to the patient.    Final diagnoses:  None    **I personally performed the services described in this documentation, which was scribed in my presence. The recorded information has been reviewed and is accurate.* I have reviewed nursing notes, vital signs, and all appropriate lab and imaging results for this patient.    Miguel Quale, PA-C 07/10/15 1404  Bethann Berkshire, MD 07/11/15 [phone]

## 2015-07-10 NOTE — Discharge Instructions (Signed)
Please soak the abscess area in a tub of warm Epsom salt water 2 times daily until resolved. Please use doxycycline 2 times daily with food. Use Tylenol for mild pain, use Tylenol codeine for more severe pain. Tylenol codeine may cause drowsiness, please use this medication with caution. Please see your primary physician, or his/her designate physician for follow-up as soon as possible. Abscess An abscess (boil or furuncle) is an infected area on or under the skin. This area is filled with yellowish-white fluid (pus) and other material (debris). HOME CARE   Only take medicines as told by your doctor.  If you were given antibiotic medicine, take it as directed. Finish the medicine even if you start to feel better.  If gauze is used, follow your doctor's directions for changing the gauze.  To avoid spreading the infection:  Keep your abscess covered with a bandage.  Wash your hands well.  Do not share personal care items, towels, or whirlpools with others.  Avoid skin contact with others.  Keep your skin and clothes clean around the abscess.  Keep all doctor visits as told. GET HELP RIGHT AWAY IF:   You have more pain, puffiness (swelling), or redness in the wound site.  You have more fluid or blood coming from the wound site.  You have muscle aches, chills, or you feel sick.  You have a fever. MAKE SURE YOU:   Understand these instructions.  Will watch your condition.  Will get help right away if you are not doing well or get worse. Document Released: 03/12/2008 Document Revised: 03/25/2012 Document Reviewed: 12/07/2011 [facility] Patient Information 2015 [place], [place]. This information is not intended to replace advice given to you by your health care provider. Make sure you discuss any questions you have with your health care provider.

## 2015-08-10 ENCOUNTER — Emergency Department (HOSPITAL_COMMUNITY)
Admission: EM | Admit: 2015-08-10 | Discharge: 2015-08-10 | Disposition: A | Discharge status: Home / Self Care | Payer: Medicaid Other | Attending: Emergency Medicine | Admitting: Emergency Medicine

## 2015-08-10 ENCOUNTER — Encounter (HOSPITAL_COMMUNITY): Payer: Self-pay

## 2015-08-10 DIAGNOSIS — E119 Type 2 diabetes mellitus without complications: Secondary | ICD-10-CM | POA: Diagnosis not present

## 2015-08-10 DIAGNOSIS — Z720 Tobacco use: Secondary | ICD-10-CM | POA: Diagnosis not present

## 2015-08-10 DIAGNOSIS — K649 Unspecified hemorrhoids: Secondary | ICD-10-CM

## 2015-08-10 DIAGNOSIS — R238 Other skin changes: Secondary | ICD-10-CM | POA: Diagnosis not present

## 2015-08-10 DIAGNOSIS — Z79899 Other long term (current) drug therapy: Secondary | ICD-10-CM | POA: Diagnosis not present

## 2015-08-10 DIAGNOSIS — Z87828 Personal history of other (healed) physical injury and trauma: Secondary | ICD-10-CM | POA: Insufficient documentation

## 2015-08-10 DIAGNOSIS — I10 Essential (primary) hypertension: Secondary | ICD-10-CM | POA: Insufficient documentation

## 2015-08-10 DIAGNOSIS — K644 Residual hemorrhoidal skin tags: Secondary | ICD-10-CM | POA: Diagnosis not present

## 2015-08-10 DIAGNOSIS — K6289 Other specified diseases of anus and rectum: Secondary | ICD-10-CM | POA: Diagnosis present

## 2015-08-10 LAB — CBG MONITORING, ED: Glucose-Capillary: 386 mg/dL — ABNORMAL HIGH (ref 65–99)

## 2015-08-10 MED ORDER — HYDROCORTISONE 2.5 % RE CREA: TOPICAL_CREAM | RECTAL | Status: DC

## 2015-08-10 MED ORDER — DOCUSATE SODIUM 100 MG PO CAPS: 100.0000 mg | ORAL_CAPSULE | Freq: Two times a day (BID) | ORAL | Status: DC

## 2015-08-10 MED ORDER — LIDOCAINE-EPINEPHRINE (PF) 1 %-1:200000 IJ SOLN
INTRAMUSCULAR | Status: AC
  Filled 2015-08-10: qty 10

## 2015-08-10 MED ORDER — POVIDONE-IODINE 10 % EX SOLN
CUTANEOUS | Status: AC
  Filled 2015-08-10: qty 118

## 2015-08-10 MED ORDER — OXYCODONE-ACETAMINOPHEN 5-325 MG PO TABS
2.0000 | ORAL_TABLET | Freq: Once | ORAL | Status: AC
  Administered 2015-08-10: 2 via ORAL
  Filled 2015-08-10: qty 2

## 2015-08-10 NOTE — Discharge Instructions (Signed)
Mr. Miguel Weber,  Nice meeting you. Please soak in warm baths as we discussed, and follow-up with your primary care provider within one week. I hope you feel better soon!  S. Lane HackerNicole Marcellis Frampton, PA-C Hemorrhoids Hemorrhoids are swollen veins around the rectum or anus. There are two types of hemorrhoids:   Internal hemorrhoids. These occur in the veins just inside the rectum. They may poke through to the outside and become irritated and painful.  External hemorrhoids. These occur in the veins outside the anus and can be felt as a painful swelling or hard lump near the anus. CAUSES  Pregnancy.   Obesity.   Constipation or diarrhea.   Straining to have a bowel movement.   Sitting for long periods on the toilet.  Heavy lifting or other activity that caused you to strain.  Anal intercourse. SYMPTOMS   Pain.   Anal itching or irritation.   Rectal bleeding.   Fecal leakage.   Anal swelling.   One or more lumps around the anus.  DIAGNOSIS  Your caregiver may be able to diagnose hemorrhoids by visual examination. Other examinations or tests that may be performed include:   Examination of the rectal area with a gloved hand (digital rectal exam).   Examination of anal canal using a small tube (scope).   A blood test if you have lost a significant amount of blood.  A test to look inside the colon (sigmoidoscopy or colonoscopy). TREATMENT Most hemorrhoids can be treated at home. However, if symptoms do not seem to be getting better or if you have a lot of rectal bleeding, your caregiver may perform a procedure to help make the hemorrhoids get smaller or remove them completely. Possible treatments include:   Placing a rubber band at the base of the hemorrhoid to cut off the circulation (rubber band ligation).   Injecting a chemical to shrink the hemorrhoid (sclerotherapy).   Using a tool to burn the hemorrhoid (infrared light therapy).   Surgically removing the  hemorrhoid (hemorrhoidectomy).   Stapling the hemorrhoid to block blood flow to the tissue (hemorrhoid stapling).  HOME CARE INSTRUCTIONS   Eat foods with fiber, such as whole grains, beans, nuts, fruits, and vegetables. Ask your doctor about taking products with added fiber in them (fibersupplements).  Increase fluid intake. Drink enough water and fluids to keep your urine clear or pale yellow.   Exercise regularly.   Go to the bathroom when you have the urge to have a bowel movement. Do not wait.   Avoid straining to have bowel movements.   Keep the anal area dry and clean. Use wet toilet paper or moist towelettes after a bowel movement.   Medicated creams and suppositories may be used or applied as directed.   Only take over-the-counter or prescription medicines as directed by your caregiver.   Take warm sitz baths for 15-20 minutes, 3-4 times a day to ease pain and discomfort.   Place ice packs on the hemorrhoids if they are tender and swollen. Using ice packs between sitz baths may be helpful.   Put ice in a plastic bag.   Place a towel between your skin and the bag.   Leave the ice on for 15-20 minutes, 3-4 times a day.   Do not use a donut-shaped pillow or sit on the toilet for long periods. This increases blood pooling and pain.  SEEK MEDICAL CARE IF:  You have increasing pain and swelling that is not controlled by treatment or medicine.  You  have uncontrolled bleeding.  You have difficulty or you are unable to have a bowel movement.  You have pain or inflammation outside the area of the hemorrhoids. MAKE SURE YOU:  Understand these instructions.  Will watch your condition.  Will get help right away if you are not doing well or get worse.   This information is not intended to replace advice given to you by your health care provider. Make sure you discuss any questions you have with your health care provider.   Document Released: 09/21/2000  Document Revised: 09/10/2012 Document Reviewed: 07/29/2012 Elsevier Interactive Patient Education [employer]

## 2015-08-10 NOTE — ED Provider Notes (Signed)
CSN: [id number]     Arrival date & time 08/10/15  2034 History   First MD Initiated Contact with Patient 08/10/15 2047     Chief Complaint  Patient presents with  . Abscess    HPI   34 yo M PMH DM, HTN pw rectal pain x one week. He describes the pain as 10/10 pain scale, non-radiating, aching, constant, exacerbated by pressure and sitting. He takes oxycodone chronically for an old shoulder injury, and this provides minimal relief. He denies fever, chills, drainage, N/V/D, loss of bowel function.  Past Medical History  Diagnosis Date  . Diabetes mellitus   . Hypertension   . Shoulder injury    Past Surgical History  Procedure Laterality Date  . Cholecystectomy     History reviewed. No pertinent family history. Social History  Substance Use Topics  . Smoking status: Current Every Day Smoker -- 1.00 packs/day for 22 years    Types: Cigarettes  . Smokeless tobacco: Never Used  . Alcohol Use: Yes     Comment: occasional    Review of Systems  Ten systems are reviewed and are negative for acute change except as noted in the HPI  Allergies  Tramadol; Aspirin; and Motrin  Home Medications   Prior to Admission medications   Medication Sig Start Date End Date Taking? Authorizing Provider  amLODipine (NORVASC) 10 MG tablet Take 10 mg by mouth daily.   Yes Historical Provider, MD  diazepam (VALIUM) 10 MG tablet Take 1 tablet (10 mg total) by mouth every 6 (six) hours as needed for anxiety or muscle spasms. 10/02/14  Yes Glynn Octave, MD  lisinopril-hydrochlorothiazide (PRINZIDE,ZESTORETIC) 20-12.5 MG per tablet Take 1 tablet by mouth daily.   Yes Historical Provider, MD  metFORMIN (GLUCOPHAGE) 500 MG tablet Take 1,000 mg by mouth at bedtime.    Yes Historical Provider, MD  oxyCODONE-acetaminophen (ROXICET) 5-325 MG per tablet Take 1 tablet by mouth every 6 (six) hours as needed for severe pain. 04/21/15  Yes Hope Orlene Och, NP  acetaminophen-codeine (TYLENOL #3) 300-30 MG tablet  Take 1-2 tablets by mouth every 6 (six) hours as needed. Patient not taking: Reported on 08/10/2015 07/10/15   [name], PA-C  cyclobenzaprine (FLEXERIL) 10 MG tablet Take 1 tablet (10 mg total) by mouth 2 (two) times daily as needed for muscle spasms. 04/21/15   Hope Orlene Och, NP  docusate sodium (COLACE) 100 MG capsule Take 1 capsule (100 mg total) by mouth every 12 (twelve) hours. 08/10/15   Melton Krebs, PA-C  doxycycline (VIBRAMYCIN) 100 MG capsule Take 1 capsule (100 mg total) by mouth 2 (two) times daily. Patient not taking: Reported on 08/10/2015 07/10/15   [name], PA-C  hydrocortisone (ANUSOL-HC) 2.5 % rectal cream Apply rectally 2 times daily 08/10/15   Melton Krebs, PA-C   BP 123/80 mmHg  Pulse 115  Temp(Src) 98.1 F (36.7 C) (Oral)  Resp 22  Ht  (1.93 m)  Wt 317 lb (143.79 kg)  BMI 38.60 kg/m2  SpO2 98% Physical Exam  Constitutional: He appears well-developed and well-nourished. No distress.  HENT:  Head: Normocephalic and atraumatic.  Mouth/Throat: Oropharynx is clear and moist. No oropharyngeal exudate.  Eyes: Conjunctivae are normal. Right eye exhibits no discharge. Left eye exhibits no discharge. No scleral icterus.  Neck: No tracheal deviation present.  Cardiovascular: Normal rate, regular rhythm, normal heart sounds and intact distal pulses.  Exam reveals no gallop and no friction rub.   No murmur heard. Pulmonary/Chest: Effort  normal and breath sounds normal. No respiratory distress. He has no wheezes. He has no rales. He exhibits no tenderness.  Abdominal: Soft. Bowel sounds are normal. He exhibits no distension and no mass. There is no tenderness. There is no rebound and no guarding.  Genitourinary:  2 cm thrombosed, tender, perianal hemorrhoid at 5 o'clock (patient prone) without surrounding erythema, edema, drainage. Rectal exam deferred due to marked tenderness at anus.   Musculoskeletal: He exhibits no edema.  Lymphadenopathy:    He  has no cervical adenopathy.  Neurological: He is alert. Coordination normal.  Skin: Skin is warm and dry. He is not diaphoretic. No erythema.  Flesh-colored papules scattered diffusely overlying scaled, slightly erythematous skin on medial posterior thighs bilaterally.  Psychiatric: He has a normal mood and affect. His behavior is normal.  Patient appears uncomfortable  Nursing note and vitals reviewed.   ED Course  Procedures  INCISION AND DRAINAGE Performed by: Anselmo RodSamantha N Deanna Wiater and Eber HongBrian Miller, MD Consent: Verbal consent obtained. Risks and benefits: risks, benefits and alternatives were discussed Type: abscess  Body area: Perianal  Anesthesia: local infiltration  Incision was made with a scalpel.  Local anesthetic: lidocaine 2% with epinephrine  Anesthetic total: 5 ml  Complexity: complex Clot removal x 3  Drainage: N/A  Patient tolerance: Patient tolerated the procedure well with no immediate complications.     Labs Review Labs Reviewed  CBG MONITORING, ED - Abnormal; Notable for the following:    Glucose-Capillary 386 (*)    All other components within normal limits    MDM   Final diagnoses:  Hemorrhoids, unspecified hemorrhoid type   Patient non-toxic appearing without evidence of infection. Incision of thrombosed external hemorrhoid performed by Dr. Hyacinth MeekerMiller and myself. Patient given Perocet. Advised patient to keep incision clean and dry, take sitz baths, use colace, and anusol. Stressed importance of strict glucose control, as patient is hyperglycemic here. Patient to follow-up with PCP within one week. Discussed reasons for return. Patient in understanding and agreement with the plan. Patient can be safely discharged home.   Melton KrebsSamantha Nicole Tawnee Clegg, PA-C 08/15/15 1513  Eber HongBrian Miller, MD 08/16/15 [phone]

## 2015-08-10 NOTE — ED Provider Notes (Signed)
The patient is a 34 year old male, he presents with a rectal pain, this has been going on for approximately one week, gradually worsening, worse with sitting on it. On exam the patient has a thrombosed hemorrhoid in the perianal area, this is tender, solid, bluish in color, there is no other anal abnormalities or drainage, rectal exam deferred secondary to obvious sources of thrombosed hemorrhoid. Please see associated procedure note by physician assistant Victory Dakiniley for which I was present, assisted in anesthaetic and with incision.  Medical screening examination/treatment/procedure(s) were conducted as a shared visit with non-physician practitioner(s) and myself.  I personally evaluated the patient during the encounter.  Clinical Impression:   Final diagnoses:  Hemorrhoids, unspecified hemorrhoid type         Eber HongBrian Nichlas Pitera, MD 08/16/15 1526

## 2015-08-10 NOTE — ED Notes (Signed)
Patient c/o rectal abscess and lower abdominal pain.

## 2015-08-29 ENCOUNTER — Emergency Department (HOSPITAL_COMMUNITY)
Admission: EM | Admit: 2015-08-29 | Discharge: 2015-08-29 | Disposition: A | Discharge status: Home / Self Care | Payer: Medicaid Other | Attending: Emergency Medicine | Admitting: Emergency Medicine

## 2015-08-29 ENCOUNTER — Emergency Department (HOSPITAL_COMMUNITY): Payer: Medicaid Other

## 2015-08-29 ENCOUNTER — Encounter (HOSPITAL_COMMUNITY): Payer: Self-pay | Admitting: Emergency Medicine

## 2015-08-29 DIAGNOSIS — S0083XA Contusion of other part of head, initial encounter: Secondary | ICD-10-CM | POA: Diagnosis not present

## 2015-08-29 DIAGNOSIS — Y9389 Activity, other specified: Secondary | ICD-10-CM | POA: Diagnosis not present

## 2015-08-29 DIAGNOSIS — Y9289 Other specified places as the place of occurrence of the external cause: Secondary | ICD-10-CM | POA: Insufficient documentation

## 2015-08-29 DIAGNOSIS — I10 Essential (primary) hypertension: Secondary | ICD-10-CM | POA: Diagnosis not present

## 2015-08-29 DIAGNOSIS — Y998 Other external cause status: Secondary | ICD-10-CM | POA: Diagnosis not present

## 2015-08-29 DIAGNOSIS — Z7952 Long term (current) use of systemic steroids: Secondary | ICD-10-CM | POA: Insufficient documentation

## 2015-08-29 DIAGNOSIS — E119 Type 2 diabetes mellitus without complications: Secondary | ICD-10-CM | POA: Insufficient documentation

## 2015-08-29 DIAGNOSIS — F17210 Nicotine dependence, cigarettes, uncomplicated: Secondary | ICD-10-CM | POA: Insufficient documentation

## 2015-08-29 DIAGNOSIS — S0993XA Unspecified injury of face, initial encounter: Secondary | ICD-10-CM | POA: Diagnosis present

## 2015-08-29 DIAGNOSIS — W208XXA Other cause of strike by thrown, projected or falling object, initial encounter: Secondary | ICD-10-CM | POA: Diagnosis not present

## 2015-08-29 DIAGNOSIS — S0502XA Injury of conjunctiva and corneal abrasion without foreign body, left eye, initial encounter: Secondary | ICD-10-CM

## 2015-08-29 MED ORDER — TETRACAINE HCL 0.5 % OP SOLN
OPHTHALMIC | Status: AC
  Administered 2015-08-29: 18:00:00
  Filled 2015-08-29: qty 4

## 2015-08-29 MED ORDER — OXYCODONE-ACETAMINOPHEN 5-325 MG PO TABS: 2.0000 | ORAL_TABLET | ORAL | Status: DC | PRN

## 2015-08-29 MED ORDER — FLUORESCEIN SODIUM 1 MG OP STRP
ORAL_STRIP | OPHTHALMIC | Status: AC
  Administered 2015-08-29: 18:00:00
  Filled 2015-08-29: qty 1

## 2015-08-29 MED ORDER — TRAMADOL HCL 50 MG PO TABS: 50.0000 mg | ORAL_TABLET | Freq: Four times a day (QID) | ORAL | Status: DC | PRN

## 2015-08-29 MED ORDER — OXYCODONE-ACETAMINOPHEN 5-325 MG PO TABS
2.0000 | ORAL_TABLET | Freq: Once | ORAL | Status: AC
  Administered 2015-08-29: 2 via ORAL
  Filled 2015-08-29: qty 2

## 2015-08-29 MED ORDER — CIPROFLOXACIN HCL 0.3 % OP SOLN
1.0000 [drp] | OPHTHALMIC | Status: DC
  Administered 2015-08-29: 1 [drp] via OPHTHALMIC
  Filled 2015-08-29: qty 2.5

## 2015-08-29 NOTE — ED Notes (Signed)
Patient with no complaints at this time. Respirations even and unlabored. Skin warm/dry. Discharge instructions reviewed with patient at this time. Patient given opportunity to voice concerns/ask questions. Patient discharged at this time and left Emergency Department with steady gait.   

## 2015-08-29 NOTE — ED Notes (Signed)
Rates pain 10/10. 

## 2015-08-29 NOTE — ED Notes (Signed)
Injury to left eye during yard work.  Was cranking a Surveyor, mininglawn mower and piece of mower came up and hurt his left eye.

## 2015-08-29 NOTE — Discharge Instructions (Signed)
Continue to take antibiotic eye drops, 1-2 drops in L eye(ciprofloxacin) every 4 hours while awake for 5 days.   Corneal Abrasion The cornea is the clear covering at the front and center of the eye. When looking at the colored portion of the eye (iris), you are looking through the cornea. This very thin tissue is made up of many layers. The surface layer is a single layer of cells (corneal epithelium) and is one of the most sensitive tissues in the body. If a scratch or injury causes the corneal epithelium to come off, it is called a corneal abrasion. If the injury extends to the tissues below the epithelium, the condition is called a corneal ulcer. CAUSES   Scratches.  Trauma.  Foreign body in the eye. Some people have recurrences of abrasions in the area of the original injury even after it has healed (recurrent erosion syndrome). Recurrent erosion syndrome generally improves and goes away with time. SYMPTOMS   Eye pain.  Difficulty or inability to keep the injured eye open.  The eye becomes very sensitive to light.  Recurrent erosions tend to happen suddenly, first thing in the morning, usually after waking up and opening the eye. DIAGNOSIS  Your health care provider can diagnose a corneal abrasion during an eye exam. Dye is usually placed in the eye using a drop or a small paper strip moistened by your tears. When the eye is examined with a special light, the abrasion shows up clearly because of the dye. TREATMENT   Small abrasions may be treated with antibiotic drops or ointment alone.  A pressure patch may be put over the eye. If this is done, follow your doctor's instructions for when to remove the patch. Do not drive or use machines while the eye patch is on. Judging distances is hard to do with a patch on. If the abrasion becomes infected and spreads to the deeper tissues of the cornea, a corneal ulcer can result. This is serious because it can cause corneal scarring. Corneal  scars interfere with light passing through the cornea and cause a loss of vision in the involved eye. HOME CARE INSTRUCTIONS  Use medicine or ointment as directed. Only take over-the-counter or prescription medicines for pain, discomfort, or fever as directed by your health care provider.  Do not drive or operate machinery if your eye is patched. Your ability to judge distances is impaired.  If your health care provider has given you a follow-up appointment, it is very important to keep that appointment. Not keeping the appointment could result in a severe eye infection or permanent loss of vision. If there is any problem keeping the appointment, let your health care provider know. SEEK MEDICAL CARE IF:   You have pain, light sensitivity, and a scratchy feeling in one eye or both eyes.  Your pressure patch keeps loosening up, and you can blink your eye under the patch after treatment.  Any kind of discharge develops from the eye after treatment or if the lids stick together in the morning.  You have the same symptoms in the morning as you did with the original abrasion days, weeks, or months after the abrasion healed.   This information is not intended to replace advice given to you by your health care provider. Make sure you discuss any questions you have with your health care provider.   Document Released: 09/21/2000 Document Revised: 06/15/2015 Document Reviewed: 06/01/2013 Elsevier Interactive Patient Education 2016 [employer]  Facial or Scalp Contusion  A facial or scalp contusion is a deep bruise on the face or head. Contusions happen when an injury causes bleeding under the skin. Signs of bruising include pain, puffiness (swelling), and discolored skin. The contusion may turn blue, purple, or yellow. HOME CARE  Only take medicines as told by your doctor.  Put ice on the injured area.  Put ice in a plastic bag.  Place a towel between your skin and the bag.  Leave the ice  on for 20 minutes, 2-3 times a day. GET HELP IF:  You have bite problems.  You have pain when chewing.  You are worried about your face not healing normally. GET HELP RIGHT AWAY IF:   You have severe pain or a headache and medicine does not help.  You are very tired or confused, or your personality changes.  You throw up (vomit).  You have a nosebleed that will not stop.  You see two of everything (double vision) or have blurry vision.  You have fluid coming from your nose or ear.  You have problems walking or using your arms or legs. MAKE SURE YOU:   Understand these instructions.  Will watch your condition.  Will get help right away if you are not doing well or get worse.   This information is not intended to replace advice given to you by your health care provider. Make sure you discuss any questions you have with your health care provider.   Document Released: 09/13/2011 Document Revised: 10/15/2014 Document Reviewed: 05/07/2013 Elsevier Interactive Patient Education [employer]

## 2015-09-13 NOTE — ED Provider Notes (Signed)
CSN: [id number]     Arrival date & time 08/29/15  1438 History   First MD Initiated Contact with Patient 08/29/15 1611     Chief Complaint  Patient presents with  . Eye Injury  . Facial Injury     (Consider location/radiation/quality/duration/timing/severity/associated sxs/prior Treatment) Patient is a 34 y.o. male presenting with eye injury and facial injury.  Eye Injury  Facial Injury  Miguel Weber with L eye pain. Just before arrival, was trying to start lawnmower when something came flying up and struck him in L face. L eye pain since. Tearing. Photophobia. Mild blurred vision. Did not get knocked out. Only local pain. Doesn't wear contacts. No intervention prior to arrival.   Past Medical History  Diagnosis Date  . Diabetes mellitus   . Hypertension   . Shoulder injury    Past Surgical History  Procedure Laterality Date  . Cholecystectomy     History reviewed. No pertinent family history. Social History  Substance Use Topics  . Smoking status: Current Every Day Smoker -- 1.00 packs/day for 22 years    Types: Cigarettes  . Smokeless tobacco: Never Used  . Alcohol Use: Yes     Comment: occasional    Review of Systems  All systems reviewed and negative, other than as noted in HPI.   Allergies  Tramadol; Aspirin; and Motrin  Home Medications   Prior to Admission medications   Medication Sig Start Date End Date Taking? Authorizing Provider  amLODipine (NORVASC) 10 MG tablet Take 10 mg by mouth at bedtime.    Yes Historical Provider, MD  hydrocortisone (ANUSOL-HC) 2.5 % rectal cream Apply rectally 2 times daily Patient taking differently: Place 1 application rectally 2 (two) times daily. Apply rectally 2 times daily 08/10/15  Yes Samantha Lane HackerNicole Riley, PA-C  lisinopril-hydrochlorothiazide (PRINZIDE,ZESTORETIC) 20-12.5 MG per tablet Take 1 tablet by mouth at bedtime.    Yes Historical Provider, MD  metFORMIN (GLUCOPHAGE) 500 MG tablet Take 500 mg by mouth at bedtime.     Yes Historical Provider, MD  acetaminophen-codeine (TYLENOL #3) 300-30 MG tablet Take 1-2 tablets by mouth every 6 (six) hours as needed. Patient not taking: Reported on 08/10/2015 07/10/15   Ivery QualeHobson Bryant, PA-C  cyclobenzaprine (FLEXERIL) 10 MG tablet Take 1 tablet (10 mg total) by mouth 2 (two) times daily as needed for muscle spasms. Patient not taking: Reported on 08/29/2015 04/21/15   Janne NapoleonHope M Neese, NP  doxycycline (VIBRAMYCIN) 100 MG capsule Take 1 capsule (100 mg total) by mouth 2 (two) times daily. Patient not taking: Reported on 08/10/2015 07/10/15   Ivery QualeHobson Bryant, PA-C  oxyCODONE-acetaminophen (PERCOCET/ROXICET) 5-325 MG tablet Take 2 tablets by mouth every 4 (four) hours as needed for severe pain. 08/29/15   Raeford RazorStephen Avion Kutzer, MD  oxyCODONE-acetaminophen (ROXICET) 5-325 MG per tablet Take 1 tablet by mouth every 6 (six) hours as needed for severe pain. Patient not taking: Reported on 08/29/2015 04/21/15   Janne NapoleonHope M Neese, NP  traMADol (ULTRAM) 50 MG tablet Take 1 tablet (50 mg total) by mouth every 6 (six) hours as needed. 08/29/15   Raeford RazorStephen Tedi Hughson, MD   BP 118/79 mmHg  Pulse 81  Temp(Src) 98.3 F (36.8 C) (Oral)  Resp 18  Ht 6\' 3"  (1.905 m)  Wt 315 lb (142.883 kg)  BMI 39.37 kg/m2  SpO2 100% Physical Exam  Eyes:    Corneal abrasion in pictures area notable with fluorescein. Conjunctival injection. Anterior chamber clear. EOMI. Pain improved with tetracaine.     ED Course  Procedures (  including critical care time) Labs Review Labs Reviewed - No data to display  Imaging Review No results found. I have personally reviewed and evaluated these images and lab results as part of my medical decision-making.   EKG Interpretation None      MDM   Final diagnoses:  Facial contusion, initial encounter  Corneal abrasion, left, initial encounter   Corneal abrasion. Abx. Prn pain meds. Doesn't wear contacts. It has been determined that no acute conditions requiring further emergency  intervention are present at this time. The patient has been advised of the diagnosis and plan. I reviewed any labs and imaging including any potential incidental findings. We have discussed signs and symptoms that warrant return to the ED and they are listed in the discharge instructions.      Raeford Razor, MD 09/13/15 1350

## 2015-11-09 ENCOUNTER — Encounter (HOSPITAL_COMMUNITY): Payer: Self-pay | Admitting: Emergency Medicine

## 2015-11-09 ENCOUNTER — Emergency Department (HOSPITAL_COMMUNITY)
Admission: EM | Admit: 2015-11-09 | Discharge: 2015-11-09 | Disposition: A | Discharge status: Home / Self Care | Payer: Medicaid Other | Attending: Emergency Medicine | Admitting: Emergency Medicine

## 2015-11-09 ENCOUNTER — Emergency Department (HOSPITAL_COMMUNITY): Payer: Medicaid Other

## 2015-11-09 DIAGNOSIS — Z7984 Long term (current) use of oral hypoglycemic drugs: Secondary | ICD-10-CM | POA: Diagnosis not present

## 2015-11-09 DIAGNOSIS — I10 Essential (primary) hypertension: Secondary | ICD-10-CM | POA: Insufficient documentation

## 2015-11-09 DIAGNOSIS — Y998 Other external cause status: Secondary | ICD-10-CM | POA: Diagnosis not present

## 2015-11-09 DIAGNOSIS — W1831XA Fall on same level due to stepping on an object, initial encounter: Secondary | ICD-10-CM | POA: Diagnosis not present

## 2015-11-09 DIAGNOSIS — Z79899 Other long term (current) drug therapy: Secondary | ICD-10-CM | POA: Diagnosis not present

## 2015-11-09 DIAGNOSIS — Y93K1 Activity, walking an animal: Secondary | ICD-10-CM | POA: Insufficient documentation

## 2015-11-09 DIAGNOSIS — M25552 Pain in left hip: Secondary | ICD-10-CM

## 2015-11-09 DIAGNOSIS — F17210 Nicotine dependence, cigarettes, uncomplicated: Secondary | ICD-10-CM | POA: Diagnosis not present

## 2015-11-09 DIAGNOSIS — Z87828 Personal history of other (healed) physical injury and trauma: Secondary | ICD-10-CM | POA: Insufficient documentation

## 2015-11-09 DIAGNOSIS — E119 Type 2 diabetes mellitus without complications: Secondary | ICD-10-CM | POA: Diagnosis not present

## 2015-11-09 DIAGNOSIS — S79912A Unspecified injury of left hip, initial encounter: Secondary | ICD-10-CM | POA: Diagnosis not present

## 2015-11-09 DIAGNOSIS — Y9289 Other specified places as the place of occurrence of the external cause: Secondary | ICD-10-CM | POA: Insufficient documentation

## 2015-11-09 MED ORDER — METHOCARBAMOL 500 MG PO TABS: 500.0000 mg | ORAL_TABLET | Freq: Two times a day (BID) | ORAL | Status: DC

## 2015-11-09 MED ORDER — PREDNISONE 20 MG PO TABS: ORAL_TABLET | ORAL | Status: DC

## 2015-11-09 NOTE — ED Provider Notes (Signed)
CSN: [id number]     Arrival date & time 11/09/15  1036 History   First MD Initiated Contact with Patient 11/09/15 1051     Chief Complaint  Patient presents with  . Hip Pain     (Consider location/radiation/quality/duration/timing/severity/associated sxs/prior Treatment) Patient is a 35 y.o. male presenting with hip pain. The history is provided by the patient and medical records.  Hip Pain Associated symptoms include arthralgias.    34 year old male with history of hypertension, diabetes, presenting to the ED for left hip pain. Patient states 3 days ago he was walking his dog, he stopped to talk to his neighbor and his dog startled by cat and jerked him causing him to step into a hole and fall onto his left hip. No head injury or loss of consciousness. Patient states he continued to have pain in the left hip and tightness which he describes as a muscle spasm. He denies any numbness or weakness of his left leg. No bowel or bladder incontinence. Patient is not taking any medications prior to arrival as he reports he is allergic to NSAIDs and cannot take Tylenol due to fatty liver.  VSS.  Past Medical History  Diagnosis Date  . Diabetes mellitus   . Hypertension   . Shoulder injury    Past Surgical History  Procedure Laterality Date  . Cholecystectomy     No family history on file. Social History  Substance Use Topics  . Smoking status: Current Every Day Smoker -- 1.00 packs/day for 22 years    Types: Cigarettes  . Smokeless tobacco: Never Used  . Alcohol Use: Yes     Comment: occasional    Review of Systems  Musculoskeletal: Positive for arthralgias.  All other systems reviewed and are negative.     Allergies  Tramadol; Tylenol; Aspirin; and Motrin  Home Medications   Prior to Admission medications   Medication Sig Start Date End Date Taking? Authorizing Provider  acetaminophen-codeine (TYLENOL #3) 300-30 MG tablet Take 1-2 tablets by mouth every 6 (six) hours as  needed. Patient not taking: Reported on 08/10/2015 07/10/15   [name], PA-C  amLODipine (NORVASC) 10 MG tablet Take 10 mg by mouth at bedtime.     Historical Provider, MD  cyclobenzaprine (FLEXERIL) 10 MG tablet Take 1 tablet (10 mg total) by mouth 2 (two) times daily as needed for muscle spasms. Patient not taking: Reported on 08/29/2015 04/21/15   Janne Napoleon, NP  doxycycline (VIBRAMYCIN) 100 MG capsule Take 1 capsule (100 mg total) by mouth 2 (two) times daily. Patient not taking: Reported on 08/10/2015 07/10/15   [name], PA-C  hydrocortisone (ANUSOL-HC) 2.5 % rectal cream Apply rectally 2 times daily Patient taking differently: Place 1 application rectally 2 (two) times daily. Apply rectally 2 times daily 08/10/15   Melton Krebs, PA-C  lisinopril-hydrochlorothiazide (PRINZIDE,ZESTORETIC) 20-12.5 MG per tablet Take 1 tablet by mouth at bedtime.     Historical Provider, MD  metFORMIN (GLUCOPHAGE) 500 MG tablet Take 500 mg by mouth at bedtime.     Historical Provider, MD  oxyCODONE-acetaminophen (PERCOCET/ROXICET) 5-325 MG tablet Take 2 tablets by mouth every 4 (four) hours as needed for severe pain. 08/29/15   Raeford Razor, MD  oxyCODONE-acetaminophen (ROXICET) 5-325 MG per tablet Take 1 tablet by mouth every 6 (six) hours as needed for severe pain. Patient not taking: Reported on 08/29/2015 04/21/15   Janne Napoleon, NP  traMADol (ULTRAM) 50 MG tablet Take 1 tablet (50 mg total)  by mouth every 6 (six) hours as needed. 08/29/15   Raeford Razor, MD   BP 137/85 mmHg  Pulse 86  Temp(Src) 98.2 F (36.8 C) (Oral)  Resp 18  Ht  (1.956 m)  Wt 145.151 kg  BMI 37.94 kg/m2  SpO2 100%   Physical Exam  Constitutional: He is oriented to person, place, and time. He appears well-developed and well-nourished.  HENT:  Head: Normocephalic and atraumatic.  Mouth/Throat: Oropharynx is clear and moist.  Eyes: Conjunctivae and EOM are normal. Pupils are equal, round, and reactive to  light.  Neck: Normal range of motion.  Cardiovascular: Normal rate, regular rhythm and normal heart sounds.   Pulmonary/Chest: Effort normal and breath sounds normal.  Abdominal: Soft. Bowel sounds are normal.  Musculoskeletal: Normal range of motion.  Lying with left hip flexed; pain noted with full extension; muscles left hamstring and quadriceps do feel tense at proximal aspect near left hip; no acute bony deformity noted; no leg shortening; ambulatory without difficulty  Neurological: He is alert and oriented to person, place, and time.  Skin: Skin is warm and dry.  Psychiatric: He has a normal mood and affect.  Nursing note and vitals reviewed.   ED Course  Procedures (including critical care time) Labs Review Labs Reviewed - No data to display  Imaging Review Dg Hip Unilat With Pelvis 2-3 Views Left  11/09/2015  CLINICAL DATA:  Status post fall 3 days ago with a left hip injury. Continued pain. Initial encounter. EXAM: DG HIP (WITH OR WITHOUT PELVIS) 2-3V LEFT COMPARISON:  Plain films left hip 04/21/2015. FINDINGS: There is no evidence of hip fracture or dislocation. There is no evidence of arthropathy or other focal bone abnormality. IMPRESSION: Negative exam. Electronically Signed   By: Drusilla Kanner M.D.   On: 11/09/2015 11:23   I have personally reviewed and evaluated these images and lab results as part of my medical decision-making.   EKG Interpretation None      MDM   Final diagnoses:  Left hip pain   35 year old male here with left hip pain after falling while walking his dog. No head injury or loss of consciousness. Left hip without any bony deformities. Muscles of his left hamstring and quadriceps do feel tense near left hip. There is no leg shortening. Patient is ambulatory without difficulty. X-ray was obtained which is negative for acute findings. Leg remains neurovascularly intact. Clinically not consistent with septic joint. Patient we discharged home with  Robaxin and prednisone as he reports allergies to NSAIDs, tramadol, and Tylenol. Encouraged follow-up with PCP in the area, given resource guide to help with this.  Discussed plan with patient, he/she acknowledged understanding and agreed with plan of care.  Return precautions given for new or worsening symptoms.  Garlon Hatchet, PA-C 11/09/15 1214  Bethann Berkshire, MD 11/11/15 [id number]

## 2015-11-09 NOTE — Discharge Instructions (Signed)
Take the prescribed medication as directed. Follow-up with a primary care physician in the area.  See resource guide to help with this. Return to the ED for new or worsening symptoms.   Emergency Department Resource Guide 1) Find a Doctor and Pay Out of Pocket Although you won't have to find out who is covered by your insurance plan, it is a good idea to ask around and get recommendations. You will then need to call the office and see if the doctor you have chosen will accept you as a new patient and what types of options they offer for patients who are self-pay. Some doctors offer discounts or will set up payment plans for their patients who do not have insurance, but you will need to ask so you aren't surprised when you get to your appointment.  2) Contact Your Local Health Department Not all health departments have doctors that can see patients for sick visits, but many do, so it is worth a call to see if yours does. If you don't know where your local health department is, you can check in your phone book. The CDC also has a tool to help you locate your state's health department, and many state websites also have listings of all of their local health departments.  3) Find a Walk-in Clinic If your illness is not likely to be very severe or complicated, you may want to try a walk in clinic. These are popping up all over the country in pharmacies, drugstores, and shopping centers. They're usually staffed by nurse practitioners or physician assistants that have been trained to treat common illnesses and complaints. They're usually fairly quick and inexpensive. However, if you have serious medical issues or chronic medical problems, these are probably not your best option.  No Primary Care Doctor: - Call Health Connect at  [phone] - they can help you locate a primary care doctor that  accepts your insurance, provides certain services, etc. - Physician Referral Service- [phone]  Chronic Pain  Problems: Organization         Address  Phone   Notes  [facility]  [phone] Patients need to be referred by their primary care doctor.   Medication Assistance: Organization         Address  Phone   Notes  [facility] [address] [place], [place] [address] [phone] --Must be a resident of [facility] -- Must have NO insurance coverage whatsoever (no Medicaid/ Medicare, etc.) -- The pt. MUST have a primary care doctor that directs their care regularly and follows them in the community   [facility]  [phone]   [employer]  [phone]    Agencies that provide inexpensive medical care: Organization         Address  Phone   Notes  [facility]  [phone]   [facility]    [phone]   [facility] [address] [place], [place] [address] [phone]   [facility] [address] [place], [place] [phone]   Planned Parenthood    [phone]   [facility]    [phone]   [facility]  [address], [place] Phone:  [phone], Fax:  [phone] Hours of Operation:  9 am - 6 pm, M-F.  Also accepts Medicaid/Medicare and self-pay.  [facility]  [address], [place] Phone: [phone], Fax: [phone]. Hours of Operation:  8:30 am - 5:30 pm, M-F.  Also accepts Medicaid and self-pay.  °[facility] [place] [address], [place] Phone: [phone]   °[facility] [address], [place], [place] [phone], Ext. 123 Mondays & Thursdays: 7-9 AM.  First 15 patients are seen on a first come, first serve basis. °  ° °Medicaid-accepting [place] Providers: ° °Organization         Address  Phone   Notes  °[facility] [address], [place] [phone] Also  accepts self-pay patients.  °[facility] [address], [place] ° [phone]   °[facility] [address], [place] [phone]) [phone]   °[facility] [address] [place] [address], [place] [phone]) [phone]   °Veita Bland [address], [place]  ° [phone] Only accepts [place] Access Medicaid patients after they have their name applied to their card.  ° °Self-Pay (no insurance) in [place]: ° °Organization         Address  Phone   Notes  °Sickle Cell Patients, [facility] [address], [place] [phone]) [phone]   °[facility] [address], [place] [phone]) [phone]   °[facility] [place] ° [address], [place] [phone]) [phone]   °[facility]/Dr. Osei-Bonsu ° [address] [place] [address], [place] or [address], [place] [phone] Phone number for both [place] and [place] locations is the same.  °[facility] [address], [place] ([phone]) [phone]   °[facility] [address] [place] [address], [place] or [address] [phone]) [phone] °([phone]) [phone]   °[facility] [address], [place] ([phone]) [phone], phone; ([phone], fax Sees patients 1st and 3rd Saturday of every month.  Must not qualify for public or private insurance (i.e. Medicaid, Medicare, [place] Health Choice, Veterans' Benefits) • Household income should be no more than 200% of the poverty level •The clinic cannot treat you if you are pregnant or think you are pregnant • Sexually transmitted diseases are not treated at the clinic.  ° ° °Dental Care: °Organization         Address  Phone  Notes  °[place] [facility] [address], [place] [phone]) [phone] Accepts children up to age 21 who are enrolled in Medicaid or [place] Health Choice; pregnant  women with a Medicaid card; and children who have applied for Medicaid or [place] Health Choice, but were declined, whose parents can pay a reduced fee at time of service.  °[place] [facility] [place]  [address], [place] [phone]) [phone] Accepts children up to age 21 who are enrolled in Medicaid or [place] Health Choice; pregnant women with a Medicaid card; and children who have applied for Medicaid or  Health Choice, but were declined, whose parents can pay a reduced fee at time of service.  °[facility] ° [address],  ([phone]) [phone] Patients are seen by appointment only. Walk-ins are not accepted. [facility] will see patients 18 years of age and older. °Monday - Tuesday (8am-5pm) °Most Wednesdays (8:30-5pm) °$30 per visit, cash only  °[facility]  [address], [facility] [phone] Patients are seen by appointment only. Walk-ins are not accepted. [place] will see patients 60 years of age and older. One Wednesday Evening (Monthly: Volunteer Based).  $30 per visit, cash only  [place]  [phone] for adults; Children under age 4, call Graduate Pediatric Dentistry at [phone]. Children aged 49-14, please call [phone] to request a pediatric application.  Dental services are provided in all areas of dental care including fillings, crowns and bridges, complete and partial dentures, implants, gum treatment, root canals, and extractions. Preventive care is also provided. Treatment is provided to both adults and children. Patients are selected via a lottery and there is often a waiting list.   [facility] [address] [place]  [phone] [email or web address]   [facility] [address] [place], [place] [phone], Ext. [phone] Second and Fourth Thursday of each month, opens at 6:30 AM; Clinic ends at 9 AM.  Patients are  seen on a first-come first-served basis, and a limited number are seen during each clinic.   [facility]  [address] Hillard Danker [place], [place] [phone]   Eligibility Requirements You must have lived in [place], [place], or [place] counties for at least the last three months.   You cannot be eligible for state or federal sponsored [employer], including [employer], [place], or [employer].   You generally cannot be eligible for healthcare insurance through your employer.    How to apply: Eligibility screenings are held every Tuesday and Wednesday afternoon from 1:00 pm until 4:00 pm. You do not need an appointment for the interview!  [facility] [address] [place], [place]   [place] Department  [place]  [phone]    Behavioral Health Resources in the Community: Intensive Outpatient Programs Organization         Address  Phone  Notes  [facility]. [address], [place], [place] [phone]   [facility] [address], [place], [place]   [facility] [address] [place] [address], [place], [place]   [place] [address],  [place], [place] or [phone]   Substance Abuse Resources Organization         Address  Phone  Notes  [facility]  [phone]   [place]  [phone]   [facility] [place]   Chinita Pester  [phone]   Residential & Outpatient Substance Abuse Program  [phone]   Psychological Services Organization         Address  Phone  Notes  [facility] [place]  [place]  [phone]   [place] [address] [place] [phone] or 7327526419    Mobile Crisis  Teams Organization         Address  Phone  Notes  [facility], Mobile Crisis Care Unit  [phone]   [facility]  [address] [place], [place]   [facility] [address] [place] [phone]    Self-Help/Support Groups Organization         Address  Phone             Notes  [facility] [place] - variety of support groups  [place] Call for more information  Narcotics Anonymous (NA), [facility] [address], [employer] [place]  2 meetings at this location   Special educational needs teacher         Address  Phone  Notes  [facility] [place],    [place]  [phone]   [facility]  [address], [place] [address], [place], [place]   [place], [place] [phone] Admissions: 8am-3pm M-F  Incentives Substance [place] [address],    [place], [place] [phone]   [facility] [address] [place], [place], [place]   [facility] [address]  [place], [place]   [facility] - Intensive Outpatient [address], [place], [place]   [facility] [place]  [place], [place] [phone] or [phone]   [facility] ([facility]) [address] [place], [place] Accepts Medicaid  [facility] [address],  [place] [phone] Substance Abuse/Addiction Treatment   [facility] Organization         Address  Phone  Notes  [facility]  [phone]   Domenic Schwab, PhD [address] Arlis Porta [place], [place]   [phone] or [phone]   [facility], [place] [phone]   [facility] [address], [place], [place] [phone]  Insurance/Medicaid/sponsorship through [facility] [address] [place] [address]                                    [place], [place] [phone] [place] [address], [place] [phone]    Dr. Adele Schilder  [phone]   [facility] Dept. 1) [address] [place] 2) [place] 3)  [address], [place] [phone]  [phone]   [place] [phone] or [phone] (After Hours)

## 2015-11-09 NOTE — ED Notes (Signed)
Pt reports LT hip pain x 3 days. States that he walking his dog, she began to chase something and jerked pt when she ran. Pt ambulatory. No deformity noted.

## 2015-11-16 ENCOUNTER — Emergency Department (HOSPITAL_COMMUNITY)
Admission: EM | Admit: 2015-11-16 | Discharge: 2015-11-16 | Disposition: A | Discharge status: Home / Self Care | Payer: Medicaid Other | Attending: Emergency Medicine | Admitting: Emergency Medicine

## 2015-11-16 ENCOUNTER — Encounter (HOSPITAL_COMMUNITY): Payer: Self-pay

## 2015-11-16 DIAGNOSIS — M25552 Pain in left hip: Secondary | ICD-10-CM

## 2015-11-16 DIAGNOSIS — S29002A Unspecified injury of muscle and tendon of back wall of thorax, initial encounter: Secondary | ICD-10-CM | POA: Insufficient documentation

## 2015-11-16 DIAGNOSIS — S4992XA Unspecified injury of left shoulder and upper arm, initial encounter: Secondary | ICD-10-CM | POA: Insufficient documentation

## 2015-11-16 DIAGNOSIS — I10 Essential (primary) hypertension: Secondary | ICD-10-CM | POA: Insufficient documentation

## 2015-11-16 DIAGNOSIS — Y9289 Other specified places as the place of occurrence of the external cause: Secondary | ICD-10-CM | POA: Insufficient documentation

## 2015-11-16 DIAGNOSIS — G8929 Other chronic pain: Secondary | ICD-10-CM | POA: Insufficient documentation

## 2015-11-16 DIAGNOSIS — Z7984 Long term (current) use of oral hypoglycemic drugs: Secondary | ICD-10-CM | POA: Diagnosis not present

## 2015-11-16 DIAGNOSIS — S199XXA Unspecified injury of neck, initial encounter: Secondary | ICD-10-CM | POA: Diagnosis not present

## 2015-11-16 DIAGNOSIS — Y998 Other external cause status: Secondary | ICD-10-CM | POA: Insufficient documentation

## 2015-11-16 DIAGNOSIS — M25512 Pain in left shoulder: Secondary | ICD-10-CM

## 2015-11-16 DIAGNOSIS — E119 Type 2 diabetes mellitus without complications: Secondary | ICD-10-CM | POA: Diagnosis not present

## 2015-11-16 DIAGNOSIS — F17210 Nicotine dependence, cigarettes, uncomplicated: Secondary | ICD-10-CM | POA: Insufficient documentation

## 2015-11-16 DIAGNOSIS — Z7952 Long term (current) use of systemic steroids: Secondary | ICD-10-CM | POA: Insufficient documentation

## 2015-11-16 DIAGNOSIS — W172XXA Fall into hole, initial encounter: Secondary | ICD-10-CM | POA: Insufficient documentation

## 2015-11-16 DIAGNOSIS — S79912A Unspecified injury of left hip, initial encounter: Secondary | ICD-10-CM | POA: Insufficient documentation

## 2015-11-16 DIAGNOSIS — Y93K1 Activity, walking an animal: Secondary | ICD-10-CM | POA: Insufficient documentation

## 2015-11-16 MED ORDER — DEXAMETHASONE SODIUM PHOSPHATE 4 MG/ML IJ SOLN
8.0000 mg | Freq: Once | INTRAMUSCULAR | Status: AC
  Administered 2015-11-16: 8 mg via INTRAMUSCULAR
  Filled 2015-11-16: qty 2

## 2015-11-16 MED ORDER — DIAZEPAM 5 MG PO TABS
5.0000 mg | ORAL_TABLET | Freq: Once | ORAL | Status: AC
  Administered 2015-11-16: 5 mg via ORAL
  Filled 2015-11-16: qty 1

## 2015-11-16 MED ORDER — ONDANSETRON HCL 4 MG PO TABS
4.0000 mg | ORAL_TABLET | Freq: Once | ORAL | Status: AC
Start: 2015-11-16 — End: 2015-11-16
  Administered 2015-11-16: 4 mg via ORAL
  Filled 2015-11-16: qty 1

## 2015-11-16 NOTE — ED Provider Notes (Signed)
CSN: [id number]     Arrival date & time 11/16/15  1437 History   First MD Initiated Contact with Patient 11/16/15 1609     Chief Complaint  Patient presents with  . Shoulder Pain  . Hip Pain     (Consider location/radiation/quality/duration/timing/severity/associated sxs/prior Treatment) HPI Comments:  Patient is a 35 year old male who presents to the emergency department with a complaint of left shoulder pain, and left hip pain.  The patient has a history of shoulder injury would chronic shoulder pain, diabetes mellitus, and recurrent left hip pain.  Patient states that he had shoulder pain since 2011. It frequently radiates into his neck. He says that the pain has been worse over the past 2 weeks. The patient states that he felt a pop in his 20s neck and shoulder and has been having some discomfort.  The patient also states that he was walking his dog, fell into a hole and now he has some pain of his left hip. It is of note that the patient had a similar situation in February 1, his x-rays at that time were negative for acute problem. He complains of tightness and muscle spasms from time to time. The patient has had numerous referrals to primary care as well as to specialist, but states that he cannot find anyone who will take his insurance.  Patient is a 35 y.o. male presenting with shoulder pain and hip pain. The history is provided by the patient.  Shoulder Pain Associated symptoms: back pain and neck pain   Hip Pain Associated symptoms include arthralgias and neck pain.    Past Medical History  Diagnosis Date  . Diabetes mellitus   . Hypertension   . Shoulder injury    Past Surgical History  Procedure Laterality Date  . Cholecystectomy     No family history on file. Social History  Substance Use Topics  . Smoking status: Current Every Day Smoker -- 1.00 packs/day for 22 years    Types: Cigarettes  . Smokeless tobacco: Never Used  . Alcohol Use: Yes     Comment:  occasional    Review of Systems  Musculoskeletal: Positive for back pain, arthralgias and neck pain.  All other systems reviewed and are negative.     Allergies  Tramadol; Tylenol; Aspirin; and Motrin  Home Medications   Prior to Admission medications   Medication Sig Start Date End Date Taking? Authorizing Provider  acetaminophen-codeine (TYLENOL #3) 300-30 MG tablet Take 1-2 tablets by mouth every 6 (six) hours as needed. Patient not taking: Reported on 08/10/2015 07/10/15   [name], PA-C  amLODipine (NORVASC) 10 MG tablet Take 10 mg by mouth at bedtime.     Historical Provider, MD  cyclobenzaprine (FLEXERIL) 10 MG tablet Take 1 tablet (10 mg total) by mouth 2 (two) times daily as needed for muscle spasms. Patient not taking: Reported on 08/29/2015 04/21/15   Janne Napoleon, NP  doxycycline (VIBRAMYCIN) 100 MG capsule Take 1 capsule (100 mg total) by mouth 2 (two) times daily. Patient not taking: Reported on 08/10/2015 07/10/15   [name], PA-C  hydrocortisone (ANUSOL-HC) 2.5 % rectal cream Apply rectally 2 times daily Patient taking differently: Place 1 application rectally 2 (two) times daily. Apply rectally 2 times daily 08/10/15   Melton Krebs, PA-C  lisinopril-hydrochlorothiazide (PRINZIDE,ZESTORETIC) 20-12.5 MG per tablet Take 1 tablet by mouth at bedtime.     Historical Provider, MD  metFORMIN (GLUCOPHAGE) 500 MG tablet Take 500 mg by mouth at bedtime.  Historical Provider, MD  methocarbamol (ROBAXIN) 500 MG tablet Take 1 tablet (500 mg total) by mouth 2 (two) times daily. Patient not taking: Reported on 11/16/2015 11/09/15   Garlon Hatchet, PA-C  oxyCODONE-acetaminophen (PERCOCET/ROXICET) 5-325 MG tablet Take 2 tablets by mouth every 4 (four) hours as needed for severe pain. Patient not taking: Reported on 11/16/2015 08/29/15   Raeford Razor, MD  oxyCODONE-acetaminophen (ROXICET) 5-325 MG per tablet Take 1 tablet by mouth every 6 (six) hours as needed for severe  pain. Patient not taking: Reported on 08/29/2015 04/21/15   Janne Napoleon, NP  predniSONE (DELTASONE) 20 MG tablet Take 40 mg by mouth daily for 3 days, then 20mg  by mouth daily for 3 days, then 10mg  daily for 3 days Patient not taking: Reported on 11/16/2015 11/09/15   Garlon Hatchet, PA-C  traMADol (ULTRAM) 50 MG tablet Take 1 tablet (50 mg total) by mouth every 6 (six) hours as needed. Patient not taking: Reported on 11/16/2015 08/29/15   Raeford Razor, MD   BP 136/83 mmHg  Pulse 88  Temp(Src) 98 F (36.7 C) (Oral)  Resp 18  Ht 6\' 4"  (1.93 m)  Wt 142.883 kg  BMI 38.36 kg/m2  SpO2 98% Physical Exam  Constitutional: He is oriented to person, place, and time. He appears well-developed and well-nourished.  Non-toxic appearance.  HENT:  Head: Normocephalic.  Right Ear: Tympanic membrane and external ear normal.  Left Ear: Tympanic membrane and external ear normal.  Eyes: EOM and lids are normal. Pupils are equal, round, and reactive to light.  Neck: Normal range of motion. Neck supple. Carotid bruit is not present.  Cardiovascular: Normal rate, regular rhythm, normal heart sounds, intact distal pulses and normal pulses.   Pulmonary/Chest: Breath sounds normal. No respiratory distress.  Abdominal: Soft. Bowel sounds are normal. There is no tenderness. There is no guarding.  Musculoskeletal: Normal range of motion.  The patient holds the left shoulder in a flexed position. There is no deformity of the shoulder noted. There no temperature or color changes noted. The capillary refill of the left upper extremity is less than 2 seconds.  The patient ambulates with only minimal problem. He changes positions from sitting to standing with minimal problem, but does complain of soreness with these activities. There is no deformity appreciated. No evidence of a hot or septic joint. There no temperature changes of the right or left lower extremity.  Lymphadenopathy:       Head (right side): No submandibular  adenopathy present.       Head (left side): No submandibular adenopathy present.    He has no cervical adenopathy.  Neurological: He is alert and oriented to person, place, and time. He has normal strength. No cranial nerve deficit or sensory deficit.  Skin: Skin is warm and dry.  Psychiatric: He has a normal mood and affect. His speech is normal.  Nursing note and vitals reviewed.   ED Course  Procedures (including critical care time) Labs Review Labs Reviewed - No data to display  Imaging Review No results found. I have personally reviewed and evaluated these images and lab results as part of my medical decision-making.   EKG Interpretation None      MDM  The patient states that he has gone down a list of possible referrals, and no one will accept his insurance. I have given the patient information on [facility], as well as the hours for Monday Tuesday and Thursday from 8 AM  to 5:30 PM. The patient was treated in the emergency department with intramuscular Decadron, and oral Valium. The patient is strongly encouraged to see the physicians at the adult medicine clinic for assistance with his ongoing shoulder and hip on problem.    Final diagnoses:  Left shoulder pain  Left hip pain    **I have reviewed nursing notes, vital signs, and all appropriate lab and imaging results for this patient.[name], PA-C 11/16/15 1642  Mancel Bale, MD 11/17/15 [phone]

## 2015-11-16 NOTE — ED Notes (Signed)
Reports of left shoulder pain that radiates into neck since 2011 but worse within the last 2 weeks. States he was putting supplies on shelf and herd of "pop". States he was walking dog and fell into a hole and now c/o left hip pain. States he has attempted to follow up but no one will accept so patient was told to come back to ED for re-evaluation. Patient ambulatory to triage without difficulty.

## 2015-11-16 NOTE — Discharge Instructions (Signed)
Your vital signs are well within normal limits. I have reviewed the x-rays from your previous emergency department visits. No acute abnormality was found. Please discuss your hip pain and shoulder pain with one of the physicians at [facility]. You were treated in the emergency department with Valium and Decadron. Please use caution getting around this afternoon. Hip Pain Your hip is the joint between your upper legs and your lower pelvis. The bones, cartilage, tendons, and muscles of your hip joint perform a lot of work each day supporting your body weight and allowing you to move around. Hip pain can range from a minor ache to severe pain in one or both of your hips. Pain may be felt on the inside of the hip joint near the groin, or the outside near the buttocks and upper thigh. You may have swelling or stiffness as well.  HOME CARE INSTRUCTIONS   Take medicines only as directed by your health care provider.  Apply ice to the injured area:  Put ice in a plastic bag.  Place a towel between your skin and the bag.  Leave the ice on for 15-20 minutes at a time, 3-4 times a day.  Keep your leg raised (elevated) when possible to lessen swelling.  Avoid activities that cause pain.  Follow specific exercises as directed by your health care provider.  Sleep with a pillow between your legs on your most comfortable side.  Record how often you have hip pain, the location of the pain, and what it feels like. SEEK MEDICAL CARE IF:   You are unable to put weight on your leg.  Your hip is red or swollen or very tender to touch.  Your pain or swelling continues or worsens after 1 week.  You have increasing difficulty walking.  You have a fever. SEEK IMMEDIATE MEDICAL CARE IF:   You have fallen.  You have a sudden increase in pain and swelling in your hip. MAKE SURE YOU:   Understand these instructions.  Will watch your condition.  Will get help right away if you  are not doing well or get worse.   This information is not intended to replace advice given to you by your health care provider. Make sure you discuss any questions you have with your health care provider.   Document Released: 03/14/2010 Document Revised: 10/15/2014 Document Reviewed: 05/21/2013 Elsevier Interactive Patient Education 2016 [employer] Inc[employer]  Shoulder Pain The shoulder is the joint that connects your arms to your body. The bones that form the shoulder joint include the upper arm bone (humerus), the shoulder blade (scapula), and the collarbone (clavicle). The top of the humerus is shaped like a ball and fits into a rather flat socket on the scapula (glenoid cavity). A combination of muscles and strong, fibrous tissues that connect muscles to bones (tendons) support your shoulder joint and hold the ball in the socket. Small, fluid-filled sacs (bursae) are located in different areas of the joint. They act as cushions between the bones and the overlying soft tissues and help reduce friction between the gliding tendons and the bone as you move your arm. Your shoulder joint allows a wide range of motion in your arm. This range of motion allows you to do things like scratch your back or throw a ball. However, this range of motion also makes your shoulder more prone to pain from overuse and injury. Causes of shoulder pain can originate from both injury and overuse and usually can be  grouped in the following four categories:  Redness, swelling, and pain (inflammation) of the tendon (tendinitis) or the bursae (bursitis).  Instability, such as a dislocation of the joint.  Inflammation of the joint (arthritis).  Broken bone (fracture). HOME CARE INSTRUCTIONS   Apply ice to the sore area.  Put ice in a plastic bag.  Place a towel between your skin and the bag.  Leave the ice on for 15-20 minutes, 3-4 times per day for the first 2 days, or as directed by your health care provider.  Stop  using cold packs if they do not help with the pain.  If you have a shoulder sling or immobilizer, wear it as long as your caregiver instructs. Only remove it to shower or bathe. Move your arm as little as possible, but keep your hand moving to prevent swelling.  Squeeze a soft ball or foam pad as much as possible to help prevent swelling.  Only take over-the-counter or prescription medicines for pain, discomfort, or fever as directed by your caregiver. SEEK MEDICAL CARE IF:   Your shoulder pain increases, or new pain develops in your arm, hand, or fingers.  Your hand or fingers become cold and numb.  Your pain is not relieved with medicines. SEEK IMMEDIATE MEDICAL CARE IF:   Your arm, hand, or fingers are numb or tingling.  Your arm, hand, or fingers are significantly swollen or turn white or blue. MAKE SURE YOU:   Understand these instructions.  Will watch your condition.  Will get help right away if you are not doing well or get worse.   This information is not intended to replace advice given to you by your health care provider. Make sure you discuss any questions you have with your health care provider.   Document Released: 07/04/2005 Document Revised: 10/15/2014 Document Reviewed: 01/17/2015 Elsevier Interactive Patient Education [employer]

## 2016-01-09 ENCOUNTER — Encounter: Payer: Self-pay | Admitting: Orthopedic Surgery

## 2016-01-09 ENCOUNTER — Ambulatory Visit: Payer: Medicaid Other | Admitting: Orthopedic Surgery

## 2016-01-12 ENCOUNTER — Encounter: Payer: Self-pay | Admitting: Orthopedic Surgery

## 2016-01-12 ENCOUNTER — Ambulatory Visit: Payer: Medicaid Other | Admitting: Orthopedic Surgery

## 2016-03-07 ENCOUNTER — Ambulatory Visit: Payer: Medicaid Other | Admitting: Orthopaedic Surgery

## 2016-03-14 ENCOUNTER — Ambulatory Visit: Payer: Medicaid Other | Admitting: Orthopaedic Surgery

## 2016-03-20 ENCOUNTER — Ambulatory Visit: Payer: Self-pay | Admitting: Orthopaedic Surgery

## 2016-03-20 ENCOUNTER — Telehealth: Payer: Self-pay | Admitting: Orthopaedic Surgery

## 2016-03-20 NOTE — Telephone Encounter (Signed)
Mr. Miguel Weber called and rescheduled again. I told him that he has rescheduled his appointment numerous times and that he must keep this one or we would not be able to see him. He keeps saying that the Miguel Weber never comes to pick him up.

## 2016-03-29 ENCOUNTER — Encounter: Payer: Self-pay | Admitting: Orthopaedic Surgery

## 2016-04-03 ENCOUNTER — Ambulatory Visit (INDEPENDENT_AMBULATORY_CARE_PROVIDER_SITE_OTHER): Payer: Medicaid Other

## 2016-04-03 ENCOUNTER — Encounter: Payer: Self-pay | Admitting: Orthopaedic Surgery

## 2016-04-03 ENCOUNTER — Telehealth: Payer: Self-pay | Admitting: Orthopaedic Surgery

## 2016-04-03 ENCOUNTER — Ambulatory Visit (INDEPENDENT_AMBULATORY_CARE_PROVIDER_SITE_OTHER): Payer: Medicaid Other | Admitting: Orthopaedic Surgery

## 2016-04-03 VITALS — BP 146/102 | HR 73 | Temp 97.5°F | Resp 16 | Ht 75.0 in | Wt 276.0 lb

## 2016-04-03 DIAGNOSIS — G894 Chronic pain syndrome: Secondary | ICD-10-CM | POA: Diagnosis not present

## 2016-04-03 DIAGNOSIS — K760 Fatty (change of) liver, not elsewhere classified: Secondary | ICD-10-CM

## 2016-04-03 DIAGNOSIS — M25512 Pain in left shoulder: Secondary | ICD-10-CM

## 2016-04-03 NOTE — Telephone Encounter (Signed)
Patient returned call - states received call from our office.  [phone]

## 2016-04-03 NOTE — Progress Notes (Signed)
Subjective:  My shoulder has been hurting since 2011    Patient ID: Miguel Weber, male    DOB: 02/17/1981, 35 y.o.   MRN: [id number]  HPI He has had pain in the left shoulder since 2011 when he was lifting a bucket of concrete over his head.  He felt a pop in the shoulder.  He was [place] by [place] doctor in [place].  He had PT.  He was seen by orthopaedic surgeon then.  He has had continued pain since then.  It has gotten worse over the last year.  He has tried various treatments including rest, heat, ice, rubs, massage, medicine with limited help.  He has been followed [facility] [place].  I did not have copies of his office visits until after he had left.  He has fatty liver and is allergic to Tylenol, Ibuprofen, Tramadol and aspirin.  He has been given Oxycodone by his family doctor in [place], last time he was given 60 pills of Percocet 7.5 on June 1st  He also has chronic pain syndrome and is managed in [place] for this.  He has anxiety, chronic also.  He has no numbness of his arm, he has limited motion.  He is not sleeping well.  He has never had a MRI of the shoulder.    I would like to get a MRI of the shoulder to see if there is a rotator cuff tear.  I told him I could not give pain medicine as he is allergic to Tylenol and that his family doctor has been giving him pain medicine.  He is to see them for pain medicine.  He did not like this saying he was here, but he understood.   Review of Systems  HENT: Negative for congestion.   Respiratory: Negative for cough and shortness of breath.   Cardiovascular: Negative for chest pain and leg swelling.  Endocrine: Negative for cold intolerance.  Musculoskeletal: Positive for myalgias and arthralgias.  Allergic/Immunologic: Negative for environmental allergies.  Psychiatric/Behavioral: The patient is nervous/anxious.    Past Medical History  Diagnosis Date  . Diabetes mellitus   . Hypertension   .  Shoulder injury     Past Surgical History  Procedure Laterality Date  . Cholecystectomy      Current Outpatient Prescriptions on File Prior to Visit  Medication Sig Dispense Refill  . amLODipine (NORVASC) 10 MG tablet Take 10 mg by mouth at bedtime.     Marland Kitchen. lisinopril-hydrochlorothiazide (PRINZIDE,ZESTORETIC) 20-12.5 MG per tablet Take 1 tablet by mouth at bedtime.     . metFORMIN (GLUCOPHAGE) 500 MG tablet Take 500 mg by mouth at bedtime.     . [DISCONTINUED] famotidine (PEPCID) 20 MG tablet Take 1 tablet (20 mg total) by mouth 2 (two) times daily. (Patient not taking: Reported on 10/02/2014) 30 tablet 0   No current facility-administered medications on file prior to visit.    Social History   Social History  . Marital Status: Single    Spouse Name: N/A  . Number of Children: N/A  . Years of Education: N/A   Occupational History  . Not on file.   Social History Main Topics  . Smoking status: Current Every Day Smoker -- 1.00 packs/day for 22 years    Types: Cigarettes  . Smokeless tobacco: Never Used  . Alcohol Use: Yes     Comment: occasional  . Drug Use: No  . Sexual Activity: Yes    Birth Control/ Protection:  None   Other Topics Concern  . Not on file   Social History Narrative    BP 146/102 mmHg  Pulse 73  Temp(Src) 97.5 F (36.4 C)  Resp 16  Ht 6\' 3"  (1.905 m)  Wt 276 lb (125.193 kg)  BMI 34.50 kg/m2     Objective:   Physical Exam  Constitutional: He is oriented to person, place, and time. He appears well-developed and well-nourished.  HENT:  Head: Normocephalic and atraumatic.  Eyes: Conjunctivae and EOM are normal. Pupils are equal, round, and reactive to light.  Neck: Normal range of motion. Neck supple.  Cardiovascular: Normal rate, regular rhythm and intact distal pulses.   Pulmonary/Chest: Effort normal.  Abdominal: Soft.  Musculoskeletal: He exhibits tenderness (Pain left shoulder, NV intact.  Abduction 100, forward flex 120, internal 25,  external 25, extension 10, adduction full.  ).  Neurological: He is alert and oriented to person, place, and time. He has normal reflexes. No cranial nerve deficit. He exhibits normal muscle tone. Coordination normal.  Skin: Skin is warm and dry.  Psychiatric: He has a normal mood and affect. His behavior is normal. Judgment and thought content normal.    X-rays were done of the left shoulder, reported separately.      Assessment & Plan:   Encounter Diagnoses  Name Primary?  . Left shoulder pain Yes  . Chronic pain syndrome   he also has fatty liver and anxiety state, both chronic.  I would like to get MRI of the left shoulder as I feel he may have rotator cuff tear. This has been going on for six years.  He has not improved.  I will see him after the MRI.  Call if any problem.  Precautions discussed.  Electronically Signed Darreld McleanWayne Amador Braddy, MD 6/27/20171:15 PM

## 2016-04-12 ENCOUNTER — Other Ambulatory Visit: Payer: Medicaid Other

## 2016-04-13 ENCOUNTER — Inpatient Hospital Stay: Admission: RE | Admit: 2016-04-13 | Payer: Medicaid Other | Source: Ambulatory Visit

## 2016-04-17 ENCOUNTER — Ambulatory Visit: Payer: Medicaid Other | Admitting: Orthopaedic Surgery

## 2016-05-30 ENCOUNTER — Encounter (HOSPITAL_COMMUNITY): Payer: Self-pay | Admitting: Emergency Medicine

## 2016-05-30 ENCOUNTER — Emergency Department (HOSPITAL_COMMUNITY)
Admission: EM | Admit: 2016-05-30 | Discharge: 2016-05-30 | Disposition: A | Discharge status: Home / Self Care | Payer: Medicaid Other | Attending: Emergency Medicine | Admitting: Emergency Medicine

## 2016-05-30 ENCOUNTER — Emergency Department (HOSPITAL_COMMUNITY): Payer: Medicaid Other

## 2016-05-30 DIAGNOSIS — E119 Type 2 diabetes mellitus without complications: Secondary | ICD-10-CM | POA: Diagnosis not present

## 2016-05-30 DIAGNOSIS — M25512 Pain in left shoulder: Secondary | ICD-10-CM | POA: Diagnosis not present

## 2016-05-30 DIAGNOSIS — Z7984 Long term (current) use of oral hypoglycemic drugs: Secondary | ICD-10-CM | POA: Diagnosis not present

## 2016-05-30 DIAGNOSIS — F17210 Nicotine dependence, cigarettes, uncomplicated: Secondary | ICD-10-CM | POA: Insufficient documentation

## 2016-05-30 DIAGNOSIS — Z79899 Other long term (current) drug therapy: Secondary | ICD-10-CM | POA: Insufficient documentation

## 2016-05-30 DIAGNOSIS — I10 Essential (primary) hypertension: Secondary | ICD-10-CM | POA: Insufficient documentation

## 2016-05-30 MED ORDER — DIAZEPAM 5 MG PO TABS
5.0000 mg | ORAL_TABLET | Freq: Once | ORAL | Status: AC
  Administered 2016-05-30: 5 mg via ORAL
  Filled 2016-05-30: qty 1

## 2016-05-30 MED ORDER — HYDROCODONE-ACETAMINOPHEN 7.5-325 MG PO TABS: 1.0000 | ORAL_TABLET | Freq: Four times a day (QID) | ORAL | 0 refills | Status: DC | PRN

## 2016-05-30 MED ORDER — OXYCODONE-ACETAMINOPHEN 5-325 MG PO TABS
1.0000 | ORAL_TABLET | Freq: Once | ORAL | Status: AC
  Administered 2016-05-30: 1 via ORAL
  Filled 2016-05-30: qty 1

## 2016-05-30 MED ORDER — CYCLOBENZAPRINE HCL 10 MG PO TABS: 10.0000 mg | ORAL_TABLET | Freq: Three times a day (TID) | ORAL | 0 refills | Status: DC | PRN

## 2016-05-30 NOTE — Discharge Instructions (Signed)
Follow-up with Dr. Harrison's office .   °

## 2016-05-30 NOTE — ED Triage Notes (Signed)
Pt c/o LT shoulder pain x2 days after lifting a something heavy.

## 2016-05-31 NOTE — ED Provider Notes (Signed)
[facility]EMER[facility]Y DEPT Provider Note   CSN: [id number] Arrival date & time: 05/30/16  1352     History   Chief Complaint Chief Complaint  Patient presents with  . Shoulder Pain    HPI Ara KussmaulRichie Moga is a 35 y.o. male.  HPI   Ara KussmaulRichie Sample is a 35 y.o. male who presents to the Emergency Department complaining of sudden onset of worsening left shoulder pain.  He states that he was trying to move a refrigerator 2 days ago when he felt a sharp pain and a "pop" in his shoulder.  He has hx of left shoulder pain and states this injury has made the pain worse.  Nothing makes the pain better.  He has tried OTC pain medications without relief.  He denies fever, numbness or weakness of the UE's, neck pain  Past Medical History:  Diagnosis Date  . Diabetes mellitus   . Hypertension   . Shoulder injury     There are no active problems to display for this patient.   Past Surgical History:  Procedure Laterality Date  . CHOLECYSTECTOMY         Home Medications    Prior to Admission medications   Medication Sig Start Date End Date Taking? Authorizing Provider  amLODipine (NORVASC) 10 MG tablet Take 10 mg by mouth at bedtime.     Historical Provider, MD  cyclobenzaprine (FLEXERIL) 10 MG tablet Take 1 tablet (10 mg total) by mouth 3 (three) times daily as needed. 05/30/16   Beniah Magnan, PA-C  glipiZIDE (GLUCOTROL) 5 MG tablet Take 5 mg by mouth daily before breakfast.    Historical Provider, MD  HYDROcodone-acetaminophen (NORCO) 7.5-325 MG tablet Take 1 tablet by mouth every 6 (six) hours as needed for moderate pain. 05/30/16   Gasper Hopes, PA-C  lisinopril-hydrochlorothiazide (PRINZIDE,ZESTORETIC) 20-12.5 MG per tablet Take 1 tablet by mouth at bedtime.     Historical Provider, MD  metFORMIN (GLUCOPHAGE) 500 MG tablet Take 500 mg by mouth at bedtime.     Historical Provider, MD    Family History No family history on file.  Social History Social History  Substance Use Topics  .  Smoking status: Current Every Day Smoker    Packs/day: 1.00    Years: 22.00    Types: Cigarettes  . Smokeless tobacco: Never Used  . Alcohol use No     Comment: occasional     Allergies   Tramadol; Tylenol [acetaminophen]; Aspirin; and Motrin [ibuprofen]   Review of Systems Review of Systems  Constitutional: Negative for chills and fever.  Cardiovascular: Negative for chest pain.  Musculoskeletal: Positive for arthralgias (left shoulder pain). Negative for joint swelling and neck pain.  Skin: Negative for color change and wound.  Neurological: Negative for dizziness, weakness, numbness and headaches.  All other systems reviewed and are negative.    Physical Exam Updated Vital Signs BP 141/83 (BP Location: Right Arm)   Pulse 86   Temp 97.5 F (36.4 C) (Oral)   Resp 18   Ht 6\' 3"  (1.905 m)   Wt 113.4 kg   SpO2 99%   BMI 31.25 kg/m   Physical Exam  Constitutional: He is oriented to person, place, and time. He appears well-developed and well-nourished. No distress.  HENT:  Head: Normocephalic and atraumatic.  Neck: Normal range of motion. Neck supple. No spinous process tenderness and no muscular tenderness present. No neck rigidity. Normal range of motion present. No thyromegaly present.  Cardiovascular: Normal rate, regular rhythm and intact distal pulses.  No murmur heard. Pulmonary/Chest: Effort normal and breath sounds normal. No respiratory distress. He exhibits no tenderness.  Musculoskeletal: He exhibits tenderness. He exhibits no edema.  ttp of the anterior shoulder.  Pain reproduced with abduction. Radial pulse is brisk, distal sensation intact, CR< 2 sec. Grip strength is strong and symmetrical.   No  edema , erythema or step-off deformity of the joint.   Lymphadenopathy:    He has no cervical adenopathy.  Neurological: He is alert and oriented to person, place, and time. He has normal strength. No sensory deficit. He exhibits normal muscle tone. Coordination  normal.  Reflex Scores:      Tricep reflexes are 1+ on the right side and 2+ on the left side.      Bicep reflexes are 1+ on the right side and 2+ on the left side. Skin: Skin is warm and dry.  Nursing note and vitals reviewed.    ED Treatments / Results  Labs (all labs ordered are listed, but only abnormal results are displayed) Labs Reviewed - No data to display  EKG  EKG Interpretation None       Radiology Dg Shoulder Left  Result Date: 05/30/2016 CLINICAL DATA:  Pain for 2 days ; began after lifting heavy object EXAM: LEFT SHOULDER - 2+ VIEW COMPARISON:  April 03, 2016 FINDINGS: Frontal, Y scapular, and axillary images were obtained. There is no fracture or dislocation. The joint spaces appear intact. No erosive change. Left lung is clear. On the Y scapular image, there is question of muscle retraction. IMPRESSION: Question muscle retraction. Appropriate clinical evaluation in this regard advised. No fracture or dislocation. No apparent arthropathy. Electronically Signed   By: Bretta BangWilliam  Woodruff III M.D.   On: 05/30/2016 14:27    Procedures Procedures (including critical care time)  Medications Ordered in ED Medications  diazepam (VALIUM) tablet 5 mg (5 mg Oral Given 05/30/16 1431)  oxyCODONE-acetaminophen (PERCOCET/ROXICET) 5-325 MG per tablet 1 tablet (1 tablet Oral Given 05/30/16 1431)     Initial Impression / Assessment and Plan / ED Course  I have reviewed the triage vital signs and the nursing notes.  Pertinent labs & imaging results that were available during my care of the patient were reviewed by me and considered in my medical decision making (see chart for details).  Clinical Course    Pt with acute on chronic left shoulder pain.  Pain exacerbated after lifting a refrigerator.  XR neg for fx or disloc.  NV intact.  Pt stable for d/c and agrees to close ortho f/u.    Final Clinical Impressions(s) / ED Diagnoses   Final diagnoses:  Shoulder pain, acute, left     New Prescriptions Discharge Medication List as of 05/30/2016  3:35 PM    START taking these medications   Details  cyclobenzaprine (FLEXERIL) 10 MG tablet Take 1 tablet (10 mg total) by mouth 3 (three) times daily as needed., Starting Wed 05/30/2016, Print    HYDROcodone-acetaminophen (NORCO) 7.5-325 MG tablet Take 1 tablet by mouth every 6 (six) hours as needed for moderate pain., Starting Wed 05/30/2016, Print         Bennett Vanscyoc Elginriplett, PA-C 05/31/16 2115    Marily MemosJason Mesner, MD 06/02/16 [phone]

## 2016-06-26 ENCOUNTER — Emergency Department (HOSPITAL_COMMUNITY): Payer: Medicaid Other

## 2016-06-26 ENCOUNTER — Emergency Department (HOSPITAL_COMMUNITY)
Admission: EM | Admit: 2016-06-26 | Discharge: 2016-06-26 | Disposition: A | Discharge status: Home / Self Care | Payer: Medicaid Other | Attending: Emergency Medicine | Admitting: Emergency Medicine

## 2016-06-26 ENCOUNTER — Encounter (HOSPITAL_COMMUNITY): Payer: Self-pay | Admitting: Emergency Medicine

## 2016-06-26 DIAGNOSIS — I10 Essential (primary) hypertension: Secondary | ICD-10-CM | POA: Diagnosis not present

## 2016-06-26 DIAGNOSIS — F17210 Nicotine dependence, cigarettes, uncomplicated: Secondary | ICD-10-CM | POA: Insufficient documentation

## 2016-06-26 DIAGNOSIS — R61 Generalized hyperhidrosis: Secondary | ICD-10-CM | POA: Diagnosis not present

## 2016-06-26 DIAGNOSIS — Z79899 Other long term (current) drug therapy: Secondary | ICD-10-CM | POA: Diagnosis not present

## 2016-06-26 DIAGNOSIS — E119 Type 2 diabetes mellitus without complications: Secondary | ICD-10-CM | POA: Insufficient documentation

## 2016-06-26 DIAGNOSIS — R1012 Left upper quadrant pain: Secondary | ICD-10-CM | POA: Insufficient documentation

## 2016-06-26 DIAGNOSIS — Z7984 Long term (current) use of oral hypoglycemic drugs: Secondary | ICD-10-CM | POA: Insufficient documentation

## 2016-06-26 DIAGNOSIS — M549 Dorsalgia, unspecified: Secondary | ICD-10-CM | POA: Insufficient documentation

## 2016-06-26 DIAGNOSIS — R1010 Upper abdominal pain, unspecified: Secondary | ICD-10-CM

## 2016-06-26 DIAGNOSIS — R1011 Right upper quadrant pain: Secondary | ICD-10-CM | POA: Diagnosis not present

## 2016-06-26 LAB — URINALYSIS, ROUTINE W REFLEX MICROSCOPIC
Bilirubin Urine: NEGATIVE
Glucose, UA: NEGATIVE mg/dL
Hgb urine dipstick: NEGATIVE
KETONES UR: NEGATIVE mg/dL
LEUKOCYTES UA: NEGATIVE
NITRITE: NEGATIVE
PROTEIN: NEGATIVE mg/dL
Specific Gravity, Urine: >1.03 — ABNORMAL HIGH (ref 1.005–1.030)
pH: 5.5 (ref 5.0–8.0)

## 2016-06-26 LAB — COMPREHENSIVE METABOLIC PANEL
ALK PHOS: 49 U/L (ref 38–126)
ALT: 16 U/L — AB (ref 17–63)
AST: 13 U/L — ABNORMAL LOW (ref 15–41)
Albumin: 4.2 g/dL (ref 3.5–5.0)
Anion gap: 8 (ref 5–15)
BILIRUBIN TOTAL: 0.5 mg/dL (ref 0.3–1.2)
BUN: 7 mg/dL (ref 6–20)
CALCIUM: 8.9 mg/dL (ref 8.9–10.3)
CHLORIDE: 102 mmol/L (ref 101–111)
CO2: 28 mmol/L (ref 22–32)
CREATININE: 0.87 mg/dL (ref 0.61–1.24)
Glucose, Bld: 87 mg/dL (ref 65–99)
Potassium: 4 mmol/L (ref 3.5–5.1)
Sodium: 138 mmol/L (ref 135–145)
TOTAL PROTEIN: 7.7 g/dL (ref 6.5–8.1)

## 2016-06-26 LAB — CBC
HCT: 44.7 % (ref 39.0–52.0)
Hemoglobin: 15.9 g/dL (ref 13.0–17.0)
MCH: 32.9 pg (ref 26.0–34.0)
MCHC: 35.6 g/dL (ref 30.0–36.0)
MCV: 92.4 fL (ref 78.0–100.0)
PLATELETS: 321 10*3/uL (ref 150–400)
RBC: 4.84 MIL/uL (ref 4.22–5.81)
RDW: 13.2 % (ref 11.5–15.5)
WBC: 10 10*3/uL (ref 4.0–10.5)

## 2016-06-26 LAB — LIPASE, BLOOD: LIPASE: 27 U/L (ref 11–51)

## 2016-06-26 MED ORDER — ACETAMINOPHEN 500 MG PO TABS
500.0000 mg | ORAL_TABLET | Freq: Once | ORAL | Status: AC
  Administered 2016-06-26: 500 mg via ORAL

## 2016-06-26 MED ORDER — HYDROCODONE-ACETAMINOPHEN 5-325 MG PO TABS
1.0000 | ORAL_TABLET | Freq: Once | ORAL | Status: AC
  Administered 2016-06-26: 1 via ORAL
  Filled 2016-06-26: qty 1

## 2016-06-26 MED ORDER — IOPAMIDOL (ISOVUE-300) INJECTION 61%
100.0000 mL | Freq: Once | INTRAVENOUS | Status: AC | PRN
  Administered 2016-06-26: 100 mL via INTRAVENOUS

## 2016-06-26 MED ORDER — HYDROMORPHONE HCL 1 MG/ML IJ SOLN
1.0000 mg | Freq: Once | INTRAMUSCULAR | Status: AC
  Administered 2016-06-26: 1 mg via INTRAVENOUS
  Filled 2016-06-26: qty 1

## 2016-06-26 MED ORDER — ONDANSETRON 4 MG PO TBDP: ORAL_TABLET | ORAL | 0 refills | Status: AC

## 2016-06-26 MED ORDER — ACETAMINOPHEN 500 MG PO TABS
1000.0000 mg | ORAL_TABLET | Freq: Once | ORAL | Status: DC
  Filled 2016-06-26: qty 2

## 2016-06-26 NOTE — ED Provider Notes (Signed)
[facility] DEPT Provider Note   CSN: [id number] Arrival date & time: 06/26/16  1650  By signing my name below, I, Alyssa Grove, attest that this documentation has been prepared under the direction and in the presence of Blane Ohara, MD. Electronically Signed: Alyssa Grove, ED Scribe. 06/26/16. 8:23 PM.   History   Chief Complaint Chief Complaint  Patient presents with  . Abdominal Pain   The history is provided by the patient. No language interpreter was used.  Abdominal Pain  Pertinent negatives include fever, diarrhea, nausea, vomiting, dysuria and headaches.    HPI Comments: Miguel Weber is a 35 y.o. male with PMHx of DM and HTN who presents to the Emergency Department complaining of gradual onset, constant, RUQ and LUQ abdominal pain onset 4 days. Desribes pain as "taking a baseball bat across the ribs". He states his pain is similar to when his gallbladder ruptured in 2008. He required emergency surgery to have it removed, but denies any other abdominal surgical hx. Pt reports associated diaphoresis and back pain. He has not recently consumed alcohol. Denies fever, chills, nausea, vomiting, diarrhea, headaches, dysuria, and difficulty urinating.  Past Medical History:  Diagnosis Date  . Diabetes mellitus   . Hypertension   . Shoulder injury     There are no active problems to display for this patient.   Past Surgical History:  Procedure Laterality Date  . CHOLECYSTECTOMY       Home Medications    Prior to Admission medications   Medication Sig Start Date End Date Taking? Authorizing Provider  glipiZIDE (GLUCOTROL) 5 MG tablet Take 5 mg by mouth daily before breakfast.   Yes Historical Provider, MD  lisinopril-hydrochlorothiazide (PRINZIDE,ZESTORETIC) 20-12.5 MG per tablet Take 1 tablet by mouth at bedtime.    Yes Historical Provider, MD  metFORMIN (GLUCOPHAGE) 500 MG tablet Take 1,000 mg by mouth 2 (two) times daily with a meal.    Yes Historical Provider, MD    cyclobenzaprine (FLEXERIL) 10 MG tablet Take 1 tablet (10 mg total) by mouth 3 (three) times daily as needed. Patient not taking: Reported on 06/26/2016 05/30/16   Tammy Triplett, PA-C  HYDROcodone-acetaminophen (NORCO) 7.5-325 MG tablet Take 1 tablet by mouth every 6 (six) hours as needed for moderate pain. Patient not taking: Reported on 06/26/2016 05/30/16   Tammy Triplett, PA-C  ondansetron (ZOFRAN ODT) 4 MG disintegrating tablet 4mg  ODT q4 hours prn nausea/vomit 06/26/16   Blane Ohara, MD    Family History No family history on file.  Social History Social History  Substance Use Topics  . Smoking status: Current Every Day Smoker    Packs/day: 1.00    Years: 22.00    Types: Cigarettes  . Smokeless tobacco: Never Used  . Alcohol use No     Comment: occasional     Allergies   Tramadol; Aspirin; and Motrin [ibuprofen]   Review of Systems Review of Systems  Constitutional: Positive for diaphoresis. Negative for chills and fever.  Gastrointestinal: Positive for abdominal pain. Negative for diarrhea, nausea and vomiting.  Genitourinary: Negative for difficulty urinating and dysuria.  Neurological: Negative for headaches.  All other systems reviewed and are negative.  Physical Exam Updated Vital Signs BP 136/83 (BP Location: Right Arm)   Pulse 74   Temp 98 F (36.7 C) (Oral)   Resp 20   Ht 6\' 4"  (1.93 m)   Wt 248 lb (112.5 kg)   SpO2 100%   BMI 30.19 kg/m   Physical Exam  Constitutional: He is  oriented to person, place, and time. He appears well-developed.  HENT:  Head: Normocephalic.  Eyes: Conjunctivae and EOM are normal. No scleral icterus.  Neck: Neck supple. No thyromegaly present.  Cardiovascular: Normal rate, regular rhythm and normal heart sounds.  Exam reveals no gallop and no friction rub.   No murmur heard. Pulmonary/Chest: No stridor. He has no wheezes. He has no rales. He exhibits no tenderness.  Abdominal: He exhibits no distension. There is  tenderness. There is no rebound.  Central and epigastric tenderness No peritonitis   Musculoskeletal: Normal range of motion. He exhibits no edema.  Lymphadenopathy:    He has no cervical adenopathy.  Neurological: He is oriented to person, place, and time. He exhibits normal muscle tone. Coordination normal.  Skin: No rash noted. No erythema.  Psychiatric: He has a normal mood and affect. His behavior is normal.   ED Treatments / Results  DIAGNOSTIC STUDIES: Oxygen Saturation is 99% on RA, normal by my interpretation.    COORDINATION OF CARE: 8:16 PM Discussed treatment plan with pt at bedside which includes CT Scan and lab work and pt agreed to plan.  Labs (all labs ordered are listed, but only abnormal results are displayed) Labs Reviewed  COMPREHENSIVE METABOLIC PANEL - Abnormal; Notable for the following:       Result Value   AST 13 (*)    ALT 16 (*)    All other components within normal limits  URINALYSIS, ROUTINE W REFLEX MICROSCOPIC (NOT AT [facility]) - Abnormal; Notable for the following:    Specific Gravity, Urine >1.030 (*)    All other components within normal limits  LIPASE, BLOOD  CBC    EKG  EKG Interpretation None       Radiology Ct Abdomen Pelvis W Contrast  Result Date: 06/26/2016 CLINICAL DATA:  Initial valuation for 39 history of acute upper abdominal pain, nausea. History of prior cholecystectomy. EXAM: CT ABDOMEN AND PELVIS WITH CONTRAST TECHNIQUE: Multidetector CT imaging of the abdomen and pelvis was performed using the standard protocol following bolus administration of intravenous contrast. CONTRAST:  100mL ISOVUE-300 IOPAMIDOL (ISOVUE-300) INJECTION 61% COMPARISON:  Prior CT from 06/22/2014. FINDINGS: Lower chest: Mild hazy subsegmental atelectasis seen dependently within the visualized lung bases. Visualized lungs are otherwise clear. Hepatobiliary: The liver demonstrates a normal contrast enhanced appearance. Gallbladder surgically absent. No biliary  dilatation. Pancreas: Pancreas within normal limits without acute inflammatory changes. Spleen: Spleen is normal. Adrenals/Urinary Tract: Adrenal glands are normal. Kidneys equal in size with symmetric enhancement. No nephrolithiasis, hydronephrosis, or focal enhancing renal mass. Ureters of normal caliber without acute abnormality. Bladder decompressed without acute abnormality. Stomach/Bowel: Stomach within normal limits. No evidence for bowel obstruction. Few mildly prominent fluid-filled loops of small bowel are seen clustered within the upper and right abdomen, nonspecific, but may reflect sequela of underlying mild enteritis. Appendix is normal. No acute inflammatory changes about the colon. Moderate amount of retained stool present within the distal colon. Vascular/Lymphatic: Normal intravascular enhancement seen throughout the intra-abdominal aorta and its branch vessels. No adenopathy. Reproductive: Prostate within normal limits. Other: No free air or fluid. Musculoskeletal: No acute osseus abnormality. No worrisome lytic or blastic osseous lesions. IMPRESSION: 1. Few mildly prominent fluid-filled loops of small bowel clustered within the right abdomen, nonspecific, but may reflect sequela of underlying mild enteritis given the provided history of nausea and abdominal pain. No evidence for obstruction. 2. No other acute intra-abdominal or pelvic process identified. 3. Status post cholecystectomy. Electronically Signed   By: Sharlet SalinaBenjamin  Phill Myron M.D.   On: 06/26/2016 21:51    Procedures Procedures (including critical care time)  Medications Ordered in ED Medications  HYDROcodone-acetaminophen (NORCO/VICODIN) 5-325 MG per tablet 1 tablet (not administered)  acetaminophen (TYLENOL) tablet 500 mg (not administered)  HYDROmorphone (DILAUDID) injection 1 mg (1 mg Intravenous Given 06/26/16 2113)  iopamidol (ISOVUE-300) 61 % injection 100 mL (100 mLs Intravenous Contrast Given 06/26/16 2131)      Initial Impression / Assessment and Plan / ED Course  I have reviewed the triage vital signs and the nursing notes.  Pertinent labs & imaging results that were available during my care of the patient were reviewed by me and considered in my medical decision making (see chart for details).  Clinical Course  Patient presents with recurrent upper abdominal discomfort. CT scan ordered and results reviewed no acute findings. Pain meds given in the ER. No peritonitis on exam. Blood work reassuring.  Results and differential diagnosis were discussed with the patient/parent/guardian. Xrays were independently reviewed by myself.  Close follow up outpatient was discussed, comfortable with the plan.   Medications  HYDROcodone-acetaminophen (NORCO/VICODIN) 5-325 MG per tablet 1 tablet (not administered)  acetaminophen (TYLENOL) tablet 500 mg (not administered)  HYDROmorphone (DILAUDID) injection 1 mg (1 mg Intravenous Given 06/26/16 2113)  iopamidol (ISOVUE-300) 61 % injection 100 mL (100 mLs Intravenous Contrast Given 06/26/16 2131)    Vitals:   06/26/16 1756 06/26/16 2014  BP: 127/83 136/83  Pulse: 82 74  Resp: 20 20  Temp: 98 F (36.7 C)   TempSrc: Oral   SpO2: 99% 100%  Weight: 248 lb (112.5 kg)   Height: 6\' 4"  (1.93 m)     Final diagnoses:  Upper abdominal pain     Final Clinical Impressions(s) / ED Diagnoses   Final diagnoses:  Upper abdominal pain    New Prescriptions New Prescriptions   ONDANSETRON (ZOFRAN ODT) 4 MG DISINTEGRATING TABLET    4mg  ODT q4 hours prn nausea/vomit     Blane Ohara, MD 06/26/16 2221

## 2016-06-26 NOTE — ED Triage Notes (Signed)
Pt has been hurt x4 days  RUQ and LUQ pain  Pt states "he got sick last night when he tried to eat a sandwich."  "Larey SeatFell the same as when gallbladder ruptured."

## 2016-06-26 NOTE — Discharge Instructions (Signed)
If you were given medicines take as directed.  If you are on coumadin or contraceptives realize their levels and effectiveness is altered by many different medicines.  If you have any reaction (rash, tongues swelling, other) to the medicines stop taking and see a physician.    If your blood pressure was elevated in the ER make sure you follow up for management with a primary doctor or return for chest pain, shortness of breath or stroke symptoms.  Please follow up as directed and return to the ER or see a physician for new or worsening symptoms.  Thank you. Vitals:   06/26/16 1756 06/26/16 2014  BP: 127/83 136/83  Pulse: 82 74  Resp: 20 20  Temp: 98 F (36.7 C)   TempSrc: Oral   SpO2: 99% 100%  Weight: 248 lb (112.5 kg)   Height: 6\' 4"  (1.93 m)

## 2016-08-20 ENCOUNTER — Encounter (HOSPITAL_COMMUNITY): Payer: Self-pay | Admitting: Emergency Medicine

## 2016-08-20 ENCOUNTER — Emergency Department (HOSPITAL_COMMUNITY): Payer: Medicaid Other

## 2016-08-20 ENCOUNTER — Emergency Department (HOSPITAL_COMMUNITY)
Admission: EM | Admit: 2016-08-20 | Discharge: 2016-08-20 | Disposition: A | Discharge status: Home / Self Care | Payer: Medicaid Other | Attending: Emergency Medicine | Admitting: Emergency Medicine

## 2016-08-20 DIAGNOSIS — M545 Low back pain, unspecified: Secondary | ICD-10-CM

## 2016-08-20 DIAGNOSIS — E119 Type 2 diabetes mellitus without complications: Secondary | ICD-10-CM | POA: Insufficient documentation

## 2016-08-20 DIAGNOSIS — M25511 Pain in right shoulder: Secondary | ICD-10-CM | POA: Insufficient documentation

## 2016-08-20 DIAGNOSIS — I10 Essential (primary) hypertension: Secondary | ICD-10-CM | POA: Diagnosis not present

## 2016-08-20 DIAGNOSIS — Z79899 Other long term (current) drug therapy: Secondary | ICD-10-CM | POA: Insufficient documentation

## 2016-08-20 DIAGNOSIS — Z7984 Long term (current) use of oral hypoglycemic drugs: Secondary | ICD-10-CM | POA: Insufficient documentation

## 2016-08-20 DIAGNOSIS — F17210 Nicotine dependence, cigarettes, uncomplicated: Secondary | ICD-10-CM | POA: Diagnosis not present

## 2016-08-20 MED ORDER — METHOCARBAMOL 500 MG PO TABS
500.0000 mg | ORAL_TABLET | Freq: Once | ORAL | Status: AC
  Administered 2016-08-20: 500 mg via ORAL
  Filled 2016-08-20: qty 1

## 2016-08-20 MED ORDER — HYDROCODONE-ACETAMINOPHEN 5-325 MG PO TABS: 2.0000 | ORAL_TABLET | ORAL | 0 refills | Status: DC | PRN

## 2016-08-20 MED ORDER — METHOCARBAMOL 500 MG PO TABS: 500.0000 mg | ORAL_TABLET | Freq: Two times a day (BID) | ORAL | 0 refills | Status: DC

## 2016-08-20 MED ORDER — OXYCODONE-ACETAMINOPHEN 5-325 MG PO TABS
2.0000 | ORAL_TABLET | Freq: Once | ORAL | Status: AC
  Administered 2016-08-20: 2 via ORAL
  Filled 2016-08-20: qty 2

## 2016-08-20 NOTE — ED Triage Notes (Signed)
Pt states when he got out of bed 4 days ago he felt a pop in his back. Pt c/o pain from his R shoulder blade "all the way down my spine into my tailbone."

## 2016-08-20 NOTE — ED Provider Notes (Signed)
[facility] Provider Note   CSN: [id number] Arrival date & time: 08/20/16  1321  By signing my name below, I, Emmanuella Mensah, attest that this documentation has been prepared under the direction and in the presence of Langston MaskerKaren Bich Mchaney, PA-C. Electronically Signed: Angelene GiovanniEmmanuella Mensah, ED Scribe. 08/20/16. 3:27 PM.   History   Chief Complaint Chief Complaint  Patient presents with  . Back Pain    HPI Comments: Miguel Weber is a 35 y.o. male with a hx of DM, hypertension, and left shoulder injury (2011) who presents to the Emergency Department complaining of gradually worsening moderate pain to his entire spine onset 4 days ago. He explains that his symptoms began when he heard "several pops in his spine all the way down to my tailbone" as he was trying to get out of bed. He denies any known falls, injuries, or any trauma. He states that he is also experiencing moderate right shoulder pain and has been having intermittent episodes of bilateral hand numbness. No alleviating factors noted. Pt states that he has tried Tylenol and Icy Hot patches with no relief. He has an allergy to Tramadol, aspirin, and motrin. He denies any fever, nausea, vomiting, bowel/bladder incontinence, urinary symptoms, or any other symptoms.   The history is provided by the patient. No language interpreter was used.    Past Medical History:  Diagnosis Date  . Diabetes mellitus   . Hypertension   . Shoulder injury     There are no active problems to display for this patient.   Past Surgical History:  Procedure Laterality Date  . CHOLECYSTECTOMY         Home Medications    Prior to Admission medications   Medication Sig Start Date End Date Taking? Authorizing Provider  cyclobenzaprine (FLEXERIL) 10 MG tablet Take 1 tablet (10 mg total) by mouth 3 (three) times daily as needed. Patient not taking: Reported on 06/26/2016 05/30/16   Tammy Triplett, PA-C  glipiZIDE (GLUCOTROL) 5 MG tablet Take 5 mg by  mouth daily before breakfast.    Historical Provider, MD  HYDROcodone-acetaminophen (NORCO) 7.5-325 MG tablet Take 1 tablet by mouth every 6 (six) hours as needed for moderate pain. Patient not taking: Reported on 06/26/2016 05/30/16   Tammy Triplett, PA-C  lisinopril-hydrochlorothiazide (PRINZIDE,ZESTORETIC) 20-12.5 MG per tablet Take 1 tablet by mouth at bedtime.     Historical Provider, MD  metFORMIN (GLUCOPHAGE) 500 MG tablet Take 1,000 mg by mouth 2 (two) times daily with a meal.     Historical Provider, MD  ondansetron (ZOFRAN ODT) 4 MG disintegrating tablet 4mg  ODT q4 hours prn nausea/vomit 06/26/16   Blane OharaJoshua Zavitz, MD    Family History Family History  Problem Relation Age of Onset  . Atrial fibrillation Mother   . Diabetes Other     Social History Social History  Substance Use Topics  . Smoking status: Current Every Day Smoker    Packs/day: 1.00    Years: 22.00    Types: Cigarettes  . Smokeless tobacco: Never Used  . Alcohol use No     Comment: occasional     Allergies   Tramadol; Aspirin; and Motrin [ibuprofen]   Review of Systems Review of Systems  Constitutional: Negative for fever.  Gastrointestinal: Negative for nausea and vomiting.  Genitourinary: Negative for dysuria and hematuria.  Musculoskeletal: Positive for arthralgias and back pain.  Neurological: Positive for numbness.  All other systems reviewed and are negative.    Physical Exam Updated Vital Signs BP 152/86 (BP Location:  Left Arm)   Pulse 89   Temp 98.2 F (36.8 C) (Oral)   Resp 20   Ht 6\' 4"  (1.93 m)   Wt 270 lb (122.5 kg)   SpO2 97%   BMI 32.87 kg/m   Physical Exam  Constitutional: He is oriented to person, place, and time. He appears well-developed and well-nourished. No distress.  HENT:  Head: Normocephalic and atraumatic.  Eyes: Conjunctivae and EOM are normal.  Neck: Neck supple. No tracheal deviation present.  Cardiovascular: Normal rate.   Pulmonary/Chest: Effort normal. No  respiratory distress.  Musculoskeletal: Normal range of motion. He exhibits tenderness.  Tender right shoulder; pain with movement Diffusely tender cervical, thoracic, and lumbar spine   Neurological: He is alert and oriented to person, place, and time.  Skin: Skin is warm and dry.  Psychiatric: He has a normal mood and affect. His behavior is normal.  Nursing note and vitals reviewed.    ED Treatments / Results  DIAGNOSTIC STUDIES: Oxygen Saturation is 97% on RA, adequate by my interpretation.    COORDINATION OF CARE: 3:19 PM- Pt advised of plan for treatment and pt agrees. Pt will receive cervical spine x-ray and right shoulder x-ray for further evaluation.    Labs (all labs ordered are listed, but only abnormal results are displayed) Labs Reviewed - No data to display  EKG  EKG Interpretation None       Radiology No results found.  Procedures Procedures (including critical care time)  Medications Ordered in ED Medications - No data to display   Initial Impression / Assessment and Plan / ED Course  Langston MaskerKaren Decorian Schuenemann, PA-C has reviewed the triage vital signs and the nursing notes.  Pertinent labs & imaging results that were available during my care of the patient were reviewed by me and considered in my medical decision making (see chart for details).  Clinical Course      Patient with back pain.  No neurological deficits and normal neuro exam.  Patient can walk but states is painful.  No loss of bowel or bladder control.  No concern for cauda equina.  No fever, night sweats, weight loss, h/o cancer, IVDU.  RICE protocol and pain medicine indicated and discussed with patient.   Patient X-Ray negative for obvious fracture or dislocation. Pain managed in ED. Pt advised to follow up with orthopedics if symptoms persist for possibility of missed fracture diagnosis. Patient given brace while in ED, conservative therapy recommended and discussed. Patient will be dc home & is  agreeable with above plan.  Final Clinical Impressions(s) / ED Diagnoses   Final diagnoses:  Acute pain of right shoulder  Acute low back pain without sciatica, unspecified back pain laterality    New Prescriptions Discharge Medication List as of 08/20/2016  4:44 PM    START taking these medications   Details  HYDROcodone-acetaminophen (NORCO/VICODIN) 5-325 MG tablet Take 2 tablets by mouth every 4 (four) hours as needed., Starting Mon 08/20/2016, Print    methocarbamol (ROBAXIN) 500 MG tablet Take 1 tablet (500 mg total) by mouth 2 (two) times daily., Starting Mon 08/20/2016, Print       An After Visit Summary was printed and given to the patient. I personally performed the services in this documentation, which was scribed in my presence.  The recorded information has been reviewed and considered.   Barnet PallKaren SofiaPAC.   Lonia SkinnerLeslie K LindsaySofia, PA-C 08/21/16 [id number]    Geoffery Lyonsouglas Delo, MD 08/21/16 [id number]

## 2016-08-20 NOTE — ED Notes (Signed)
Patient transported to X-ray 

## 2016-09-17 ENCOUNTER — Emergency Department (HOSPITAL_COMMUNITY): Payer: Medicaid Other

## 2016-09-17 ENCOUNTER — Encounter (HOSPITAL_COMMUNITY): Payer: Self-pay | Admitting: Emergency Medicine

## 2016-09-17 ENCOUNTER — Emergency Department (HOSPITAL_COMMUNITY)
Admission: EM | Admit: 2016-09-17 | Discharge: 2016-09-17 | Disposition: A | Discharge status: Home / Self Care | Payer: Medicaid Other | Attending: Emergency Medicine | Admitting: Emergency Medicine

## 2016-09-17 DIAGNOSIS — Z79899 Other long term (current) drug therapy: Secondary | ICD-10-CM | POA: Diagnosis not present

## 2016-09-17 DIAGNOSIS — F17210 Nicotine dependence, cigarettes, uncomplicated: Secondary | ICD-10-CM | POA: Insufficient documentation

## 2016-09-17 DIAGNOSIS — M436 Torticollis: Secondary | ICD-10-CM | POA: Diagnosis not present

## 2016-09-17 DIAGNOSIS — M542 Cervicalgia: Secondary | ICD-10-CM | POA: Diagnosis present

## 2016-09-17 DIAGNOSIS — E119 Type 2 diabetes mellitus without complications: Secondary | ICD-10-CM | POA: Diagnosis not present

## 2016-09-17 DIAGNOSIS — Z7984 Long term (current) use of oral hypoglycemic drugs: Secondary | ICD-10-CM | POA: Insufficient documentation

## 2016-09-17 DIAGNOSIS — I10 Essential (primary) hypertension: Secondary | ICD-10-CM | POA: Insufficient documentation

## 2016-09-17 MED ORDER — HYDROCODONE-ACETAMINOPHEN 5-325 MG PO TABS: 2.0000 | ORAL_TABLET | ORAL | 0 refills | Status: DC | PRN

## 2016-09-17 MED ORDER — HYDROCODONE-ACETAMINOPHEN 5-325 MG PO TABS
2.0000 | ORAL_TABLET | Freq: Once | ORAL | Status: AC
  Administered 2016-09-17: 2 via ORAL
  Filled 2016-09-17: qty 2

## 2016-09-17 MED ORDER — METHOCARBAMOL 500 MG PO TABS: 500.0000 mg | ORAL_TABLET | Freq: Two times a day (BID) | ORAL | 0 refills | Status: DC

## 2016-09-17 NOTE — ED Provider Notes (Signed)
[facility] Provider Note   CSN: [id number] Arrival date & time: 09/17/16  1841     History   Chief Complaint Chief Complaint  Patient presents with  . Neck Pain    HPI Miguel Weber is a [age] y.o. male.  The history is provided by the patient. No language interpreter was used.  Neck Pain   This is a new problem. The problem occurs constantly. The problem has been gradually worsening. The pain is associated with nothing. There has been no fever. The pain is present in the left side. The pain is at a severity of 7/10. The pain is moderate. The pain is the same all the time. He has tried nothing for the symptoms. The treatment provided no relief.    Past Medical History:  Diagnosis Date  . Diabetes mellitus   . Hypertension   . Shoulder injury     There are no active problems to display for this patient.   Past Surgical History:  Procedure Laterality Date  . CHOLECYSTECTOMY         Home Medications    Prior to Admission medications   Medication Sig Start Date End Date Taking? Authorizing Provider  glipiZIDE (GLUCOTROL) 5 MG tablet Take 5 mg by mouth daily before breakfast.    Historical Provider, MD  HYDROcodone-acetaminophen (NORCO/VICODIN) 5-325 MG tablet Take 2 tablets by mouth every 4 (four) hours as needed. 09/17/16   Elson AreasLeslie K Kimbella Heisler, PA-C  lisinopril-hydrochlorothiazide (PRINZIDE,ZESTORETIC) 20-12.5 MG per tablet Take 1 tablet by mouth at bedtime.     Historical Provider, MD  metFORMIN (GLUCOPHAGE) 500 MG tablet Take 1,000 mg by mouth 2 (two) times daily with a meal.     Historical Provider, MD  methocarbamol (ROBAXIN) 500 MG tablet Take 1 tablet (500 mg total) by mouth 2 (two) times daily. 09/17/16   Elson AreasLeslie K Solene Hereford, PA-C  ondansetron (ZOFRAN ODT) 4 MG disintegrating tablet 4mg  ODT q4 hours prn nausea/vomit 06/26/16   Blane OharaJoshua Zavitz, MD    Family History Family History  Problem Relation Age of Onset  . Atrial fibrillation Mother   . Diabetes Other      Social History Social History  Substance Use Topics  . Smoking status: Current Every Day Smoker    Packs/day: 1.00    Years: 22.00    Types: Cigarettes  . Smokeless tobacco: Never Used  . Alcohol use No     Comment: occasional     Allergies   Tramadol; Aspirin; and Motrin [ibuprofen]   Review of Systems Review of Systems  Musculoskeletal: Positive for neck pain.  All other systems reviewed and are negative.    Physical Exam Updated Vital Signs BP 166/93 (BP Location: Left Arm)   Pulse 86   Temp 98.2 F (36.8 C) (Oral)   Resp 16   Ht 6\' 4"  (1.93 m)   Wt 122.5 kg   SpO2 98%   BMI 32.87 kg/m   Physical Exam  Constitutional: He is oriented to person, place, and time. He appears well-developed and well-nourished.  HENT:  Head: Normocephalic.  Eyes: EOM are normal.  Neck: Normal range of motion.  Pulmonary/Chest: Effort normal.  Abdominal: He exhibits no distension.  Musculoskeletal: Normal range of motion. He exhibits tenderness.  Tender cervical spine diffusely,  Tender left lateral neck  Neurological: He is alert and oriented to person, place, and time.  Psychiatric: He has a normal mood and affect.  Nursing note and vitals reviewed.    ED Treatments / Results  Labs (all labs ordered are listed, but only abnormal results are displayed) Labs Reviewed - No data to display  EKG  EKG Interpretation None       Radiology Dg Cervical Spine Complete  Result Date: 09/17/2016 CLINICAL DATA:  Neck pain for 2 days, no known injury, initial encounter EXAM: CERVICAL SPINE - COMPLETE 4+ VIEW COMPARISON:  08/20/2016 FINDINGS: There is no evidence of cervical spine fracture or prevertebral soft tissue swelling. Alignment is normal. No other significant bone abnormalities are identified. IMPRESSION: No acute abnormality noted. Electronically Signed   By: Alcide CleverMark  Lukens M.D.   On: 09/17/2016 21:50    Procedures Procedures (including critical care  time)  Medications Ordered in ED Medications  HYDROcodone-acetaminophen (NORCO/VICODIN) 5-325 MG per tablet 2 tablet (2 tablets Oral Given 09/17/16 2144)     Initial Impression / Assessment and Plan / ED Course  I have reviewed the triage vital signs and the nursing notes.  Pertinent labs & imaging results that were available during my care of the patient were reviewed by me and considered in my medical decision making (see chart for details).  Clinical Course       Final Clinical Impressions(s) / ED Diagnoses   Final diagnoses:  Neck pain  Torticollis, acute    New Prescriptions New Prescriptions   METHOCARBAMOL (ROBAXIN) 500 MG TABLET    Take 1 tablet (500 mg total) by mouth 2 (two) times daily.   No outpatient prescriptions have been marked as taking for the 09/17/16 encounter [facility](Hospital Encounter).  An After Visit Summary was printed and given to the patient.   Elson AreasLeslie K Orine Goga, PA-C 09/17/16 2219    Jacalyn LefevreJulie Haviland, MD 09/17/16 2236

## 2016-09-17 NOTE — ED Triage Notes (Addendum)
Pt reports neck pain that started 2 days ago. No known injury.

## 2016-10-12 ENCOUNTER — Encounter (HOSPITAL_COMMUNITY): Payer: Self-pay | Admitting: Emergency Medicine

## 2016-10-12 ENCOUNTER — Emergency Department (HOSPITAL_COMMUNITY)
Admission: EM | Admit: 2016-10-12 | Discharge: 2016-10-12 | Disposition: A | Discharge status: Home / Self Care | Payer: Medicaid Other | Attending: Emergency Medicine | Admitting: Emergency Medicine

## 2016-10-12 DIAGNOSIS — F17210 Nicotine dependence, cigarettes, uncomplicated: Secondary | ICD-10-CM | POA: Diagnosis not present

## 2016-10-12 DIAGNOSIS — E119 Type 2 diabetes mellitus without complications: Secondary | ICD-10-CM | POA: Insufficient documentation

## 2016-10-12 DIAGNOSIS — I10 Essential (primary) hypertension: Secondary | ICD-10-CM | POA: Diagnosis not present

## 2016-10-12 DIAGNOSIS — M62838 Other muscle spasm: Secondary | ICD-10-CM | POA: Diagnosis not present

## 2016-10-12 DIAGNOSIS — Z7984 Long term (current) use of oral hypoglycemic drugs: Secondary | ICD-10-CM | POA: Insufficient documentation

## 2016-10-12 DIAGNOSIS — M2550 Pain in unspecified joint: Secondary | ICD-10-CM | POA: Insufficient documentation

## 2016-10-12 DIAGNOSIS — M542 Cervicalgia: Secondary | ICD-10-CM | POA: Diagnosis not present

## 2016-10-12 DIAGNOSIS — Z79899 Other long term (current) drug therapy: Secondary | ICD-10-CM | POA: Diagnosis not present

## 2016-10-12 DIAGNOSIS — G8929 Other chronic pain: Secondary | ICD-10-CM | POA: Insufficient documentation

## 2016-10-12 MED ORDER — OXYCODONE-ACETAMINOPHEN 7.5-325 MG PO TABS: 1.0000 | ORAL_TABLET | ORAL | 0 refills | Status: DC | PRN

## 2016-10-12 MED ORDER — METHOCARBAMOL 500 MG PO TABS: 500.0000 mg | ORAL_TABLET | Freq: Four times a day (QID) | ORAL | 0 refills | Status: DC

## 2016-10-12 MED ORDER — OXYCODONE-ACETAMINOPHEN 5-325 MG PO TABS
1.0000 | ORAL_TABLET | Freq: Once | ORAL | Status: AC
  Administered 2016-10-12: 1 via ORAL
  Filled 2016-10-12: qty 1

## 2016-10-12 NOTE — ED Notes (Signed)
Patient given discharge instruction, verbalized understand. Patient ambulatory out of the department.  

## 2016-10-12 NOTE — ED Triage Notes (Signed)
Patient complains of neck and back pain x 1 month. States pain is worse with change of position to back.

## 2016-10-12 NOTE — ED Notes (Signed)
Upper mid back pain and neck pain

## 2016-10-12 NOTE — Discharge Instructions (Signed)
Do not drive within 4 hours of taking oxycodone or robaxin as these medicines can make you drowsy.  Avoid lifting,  Bending,  Twisting or any other activity that worsens your pain over the next week.  Apply a heating pad to your neck and shoulder area for 10-15 minutes several times daily.  You should get rechecked if your symptoms are not better over the next week,  Or you develop increased pain,  persistent weakness or numbness in your arms or loss of bladder or bowel function - these can be symptoms of a worsening condition.

## 2016-10-13 NOTE — ED Provider Notes (Signed)
[facility] DEPT Provider Note   CSN: [id number] Arrival date & time: 10/12/16  1120     History   Chief Complaint Chief Complaint  Patient presents with  . Neck Pain  . Back Pain    HPI Miguel Weber is a 36 y.o. male presenting with a one day history of return of his intermittent left neck and upper back pain x 1 month.  He denies any injuries and has no weakness or numbness in his upper extremities.  Pain is worsened with movement and he has reduced ROM especially with attempts to turn his head over the left shoulder.  He denies any recent uri symptoms, no fevers or chills.   He has tried heat therapy, tylenol and massage without relief of symptoms.    The history is provided by the patient.    Past Medical History:  Diagnosis Date  . Diabetes mellitus   . Hypertension   . Shoulder injury     There are no active problems to display for this patient.   Past Surgical History:  Procedure Laterality Date  . CHOLECYSTECTOMY         Home Medications    Prior to Admission medications   Medication Sig Start Date End Date Taking? Authorizing Provider  glipiZIDE (GLUCOTROL) 5 MG tablet Take 5 mg by mouth daily before breakfast.    Historical Provider, MD  HYDROcodone-acetaminophen (NORCO/VICODIN) 5-325 MG tablet Take 2 tablets by mouth every 4 (four) hours as needed. 09/17/16   Elson Areas, PA-C  lisinopril-hydrochlorothiazide (PRINZIDE,ZESTORETIC) 20-12.5 MG per tablet Take 1 tablet by mouth at bedtime.     Historical Provider, MD  metFORMIN (GLUCOPHAGE) 500 MG tablet Take 1,000 mg by mouth 2 (two) times daily with a meal.     Historical Provider, MD  methocarbamol (ROBAXIN) 500 MG tablet Take 1 tablet (500 mg total) by mouth 4 (four) times daily. 10/12/16   Burgess Amor, PA-C  ondansetron (ZOFRAN ODT) 4 MG disintegrating tablet 4mg  ODT q4 hours prn nausea/vomit 06/26/16   Blane Ohara, MD  oxyCODONE-acetaminophen (PERCOCET) 7.5-325 MG tablet Take 1 tablet by mouth every 4  (four) hours as needed for severe pain. 10/12/16   Burgess Amor, PA-C    Family History Family History  Problem Relation Age of Onset  . Atrial fibrillation Mother   . Diabetes Other     Social History Social History  Substance Use Topics  . Smoking status: Current Every Day Smoker    Packs/day: 1.00    Years: 22.00    Types: Cigarettes  . Smokeless tobacco: Never Used  . Alcohol use No     Comment: occasional     Allergies   Tramadol; Aspirin; and Motrin [ibuprofen]   Review of Systems Review of Systems  Constitutional: Negative for chills and fever.  Musculoskeletal: Positive for arthralgias, neck pain and neck stiffness. Negative for joint swelling and myalgias.  Neurological: Negative for weakness and numbness.     Physical Exam Updated Vital Signs BP 166/82 (BP Location: Left Arm)   Pulse 87   Temp 97.6 F (36.4 C) (Oral)   Resp 16   Ht 6\' 4"  (1.93 m)   Wt 124.7 kg   SpO2 100%   BMI 33.47 kg/m   Physical Exam  Constitutional: He appears well-developed and well-nourished.  HENT:  Head: Normocephalic.  Eyes: Conjunctivae are normal.  Neck: Muscular tenderness present. Decreased range of motion present. No edema and no erythema present.  Decreased ROM with leftward head rotation.  Left soft tissue cervical ttp.  Spasm appreciated.  Cardiovascular: Normal rate and intact distal pulses.   Pulmonary/Chest: Effort normal.  Abdominal: Soft. Bowel sounds are normal. He exhibits no distension and no mass.  Musculoskeletal: He exhibits no edema.       Cervical back: He exhibits tenderness, bony tenderness and spasm. He exhibits no swelling, no edema and no deformity.       Thoracic back: Normal.       Lumbar back: Normal.  Neurological: He is alert. He has normal strength. He displays no atrophy and no tremor. No sensory deficit. Gait normal.  Reflex Scores:      Bicep reflexes are 2+ on the right side and 2+ on the left side. No strength deficit noted in wrist  and elbow flexor and extensor muscle groups.  Equal grip strength  Skin: Skin is warm and dry.  Psychiatric: He has a normal mood and affect.  Nursing note and vitals reviewed.    ED Treatments / Results  Labs (all labs ordered are listed, but only abnormal results are displayed) Labs Reviewed - No data to display  EKG  EKG Interpretation None       Radiology No results found.  Procedures Procedures (including critical care time)  Medications Ordered in ED Medications  oxyCODONE-acetaminophen (PERCOCET/ROXICET) 5-325 MG per tablet 1 tablet (1 tablet Oral Given 10/12/16 1415)     Initial Impression / Assessment and Plan / ED Course  I have reviewed the triage vital signs and the nursing notes.  Pertinent labs & imaging results that were available during my care of the patient were reviewed by me and considered in my medical decision making (see chart for details).  Clinical Course     Imaging obtained from visit 09/17/16 reviewed, no indication for advanced imaging today, no neuro deficits.  Heat tx, ROM, massage,  Robaxin, oxycodone prescribed.  Final Clinical Impressions(s) / ED Diagnoses   Final diagnoses:  Chronic neck pain    New Prescriptions    Burgess AmorJulie Betania Dizon, PA-C 10/14/16 2123    Marily MemosJason Mesner, MD 10/15/16 2146

## 2016-11-20 ENCOUNTER — Encounter: Payer: Self-pay | Admitting: Orthopaedic Surgery

## 2016-11-20 ENCOUNTER — Ambulatory Visit (INDEPENDENT_AMBULATORY_CARE_PROVIDER_SITE_OTHER): Payer: Medicaid Other | Admitting: Orthopaedic Surgery

## 2016-11-20 VITALS — BP 150/82 | HR 83 | Temp 98.1°F | Ht 76.0 in | Wt 313.0 lb

## 2016-11-20 DIAGNOSIS — F17210 Nicotine dependence, cigarettes, uncomplicated: Secondary | ICD-10-CM

## 2016-11-20 DIAGNOSIS — M542 Cervicalgia: Secondary | ICD-10-CM

## 2016-11-20 MED ORDER — DIAZEPAM 5 MG PO TABS: 5.0000 mg | ORAL_TABLET | Freq: Four times a day (QID) | ORAL | 0 refills | Status: AC | PRN

## 2016-11-20 NOTE — Progress Notes (Signed)
Patient ZO:XWRUEA Miguel Weber, male DOB:11/19/1980, 36 y.o. VWU:[id number]  Chief Complaint  Patient presents with  . Neck Pain    HPI  Miguel Weber is a 36 y.o. male who has had neck pain since November that is getting worse.  He had x-rays of the neck done on 09-17-16 which were negative.  He has right sided neck pain that is not improving.  He has some right sided paresthesias.  He has no trauma.  He has been to the ER twice for this and has seen his family physician for it twice as well.  He is concerned it is still hurting.  He has pain moving his head to the right.  He has tried Percocet, rest, ice heat, Robaxin with minimal pain.  I will have him stop the Robaxin.  I will get MRI of the cervical spine.  I have reviewed the ER reports, the x-rays and the x-ray reports as well as reports from his family doctor. HPI  Body mass index is 38.1 kg/m.  ROS  Review of Systems  HENT: Negative for congestion.   Respiratory: Negative for cough and shortness of breath.   Cardiovascular: Negative for chest pain and leg swelling.  Endocrine: Negative for cold intolerance.  Musculoskeletal: Positive for arthralgias and myalgias.  Allergic/Immunologic: Negative for environmental allergies.  Psychiatric/Behavioral: The patient is nervous/anxious.     Past Medical History:  Diagnosis Date  . Diabetes mellitus   . Hypertension   . Shoulder injury     Past Surgical History:  Procedure Laterality Date  . CHOLECYSTECTOMY      Family History  Problem Relation Age of Onset  . Atrial fibrillation Mother   . Heart attack Mother   . Diabetes Mother   . COPD Mother   . Congestive Heart Failure Mother   . Diabetes Other   . Heart attack Maternal Grandmother   . Diabetes Maternal Grandmother   . COPD Maternal Grandmother   . Congestive Heart Failure Maternal Grandmother   . Thyroid disease Maternal Grandmother     Social History Social History  Substance Use Topics  . Smoking status: Current  Every Day Smoker    Packs/day: 1.00    Years: 22.00    Types: Cigarettes  . Smokeless tobacco: Never Used  . Alcohol use No     Comment: occasional    Allergies  Allergen Reactions  . Tramadol Other (See Comments)    REACTION: Alters mental status: "talks out of head"  . Aspirin Hives, Nausea And Vomiting and Rash  . Motrin [Ibuprofen] Hives, Nausea And Vomiting and Rash    Current Outpatient Prescriptions  Medication Sig Dispense Refill  . diazepam (VALIUM) 5 MG tablet Take 1 tablet (5 mg total) by mouth every 6 (six) hours as needed for anxiety. Take one tablet by mouth every six hours as needed for neck spasm 40 tablet 0  . glipiZIDE (GLUCOTROL) 5 MG tablet Take 5 mg by mouth daily before breakfast.    . HYDROcodone-acetaminophen (NORCO/VICODIN) 5-325 MG tablet Take 2 tablets by mouth every 4 (four) hours as needed. 16 tablet 0  . lisinopril-hydrochlorothiazide (PRINZIDE,ZESTORETIC) 20-12.5 MG per tablet Take 1 tablet by mouth at bedtime.     . metFORMIN (GLUCOPHAGE) 500 MG tablet Take 1,000 mg by mouth 2 (two) times daily with a meal.     . methocarbamol (ROBAXIN) 500 MG tablet Take 1 tablet (500 mg total) by mouth 4 (four) times daily. 40 tablet 0  . ondansetron (ZOFRAN ODT) 4  MG disintegrating tablet 4mg  ODT q4 hours prn nausea/vomit 4 tablet 0  . oxyCODONE-acetaminophen (PERCOCET) 7.5-325 MG tablet Take 1 tablet by mouth every 4 (four) hours as needed for severe pain. 30 tablet 0   No current facility-administered medications for this visit.      Physical Exam  Blood pressure (!) 150/82, pulse 83, temperature 98.1 F (36.7 C), height 6\' 4"  (1.93 m), weight (!) 313 lb (142 kg).  Constitutional: overall normal hygiene, normal nutrition, well developed, normal grooming, normal body habitus. Assistive device:none  Musculoskeletal: gait and station Limp none, muscle tone and strength are normal, no tremors or atrophy is present.  .  Neurological: coordination overall  normal.  Deep tendon reflex/nerve stretch intact.  Sensation normal.  Cranial nerves II-XII intact.   Skin:   Normal overall no scars, lesions, ulcers or rashes. No psoriasis.  Psychiatric: Alert and oriented x 3.  Recent memory intact, remote memory unclear.  Normal mood and affect. Well groomed.  Good eye contact.  Cardiovascular: overall no swelling, no varicosities, no edema bilaterally, normal temperatures of the legs and arms, no clubbing, cyanosis and good capillary refill.  Lymphatic: palpation is normal.  He has pain of the neck with tightness of the right side of the neck and the right upper trapezius with no spasm.  NV intact.  Right hand grips normal.  He has no weakness.  The patient has been educated about the nature of the problem(s) and counseled on treatment options.  The patient appeared to understand what I have discussed and is in agreement with it.  Encounter Diagnoses  Name Primary?  . Neck pain Yes  . Cigarette nicotine dependence without complication    He smokes.  He is willing to cut back.  PLAN Call if any problems.  Precautions discussed.  Continue current medications.   Return to clinic after MRI of the neck.   Stop the Robaxin.  I have given Valium but first I checked the state narcotic site.  Electronically Signed Darreld McleanWayne Ido Wollman, MD 2/13/201811:53 AM

## 2016-11-20 NOTE — Patient Instructions (Signed)
Steps to Quit Smoking Smoking tobacco can be bad for your health. It can also affect almost every organ in your body. Smoking puts you and people around you at risk for many serious long-lasting (chronic) diseases. Quitting smoking is hard, but it is one of the best things that you can do for your health. It is never too late to quit. What are the benefits of quitting smoking? When you quit smoking, you lower your risk for getting serious diseases and conditions. They can include:  Lung cancer or lung disease.  Heart disease.  Stroke.  Heart attack.  Not being able to have children (infertility).  Weak bones (osteoporosis) and broken bones (fractures). If you have coughing, wheezing, and shortness of breath, those symptoms may get better when you quit. You may also get sick less often. If you are pregnant, quitting smoking can help to lower your chances of having a baby of low birth weight. What can I do to help me quit smoking? Talk with your doctor about what can help you quit smoking. Some things you can do (strategies) include:  Quitting smoking totally, instead of slowly cutting back how much you smoke over a period of time.  Going to in-person counseling. You are more likely to quit if you go to many counseling sessions.  Using resources and support systems, such as:  Online chats with a counselor.  Phone quitlines.  Printed self-help materials.  Support groups or group counseling.  Text messaging programs.  Mobile phone apps or applications.  Taking medicines. Some of these medicines may have nicotine in them. If you are pregnant or breastfeeding, do not take any medicines to quit smoking unless your doctor says it is okay. Talk with your doctor about counseling or other things that can help you. Talk with your doctor about using more than one strategy at the same time, such as taking medicines while you are also going to in-person counseling. This can help make quitting  easier. What things can I do to make it easier to quit? Quitting smoking might feel very hard at first, but there is a lot that you can do to make it easier. Take these steps:  Talk to your family and friends. Ask them to support and encourage you.  Call phone quitlines, reach out to support groups, or work with a counselor.  Ask people who smoke to not smoke around you.  Avoid places that make you want (trigger) to smoke, such as:  Bars.  Parties.  Smoke-break areas at work.  Spend time with people who do not smoke.  Lower the stress in your life. Stress can make you want to smoke. Try these things to help your stress:  Getting regular exercise.  Deep-breathing exercises.  Yoga.  Meditating.  Doing a body scan. To do this, close your eyes, focus on one area of your body at a time from head to toe, and notice which parts of your body are tense. Try to relax the muscles in those areas.  Download or buy apps on your mobile phone or tablet that can help you stick to your quit plan. There are many free apps, such as QuitGuide from the CDC (Centers for Disease Control and Prevention). You can find more support from smokefree.gov and other websites. This information is not intended to replace advice given to you by your health care provider. Make sure you discuss any questions you have with your health care provider. Document Released: 07/21/2009 Document Revised: 05/22/2016 Document   Reviewed: 02/08/2015 Elsevier Interactive Patient Education  2017 [employer]vier Inc.  

## 2016-11-30 ENCOUNTER — Other Ambulatory Visit: Payer: Medicaid Other

## 2016-12-07 ENCOUNTER — Other Ambulatory Visit: Payer: Medicaid Other

## 2016-12-16 ENCOUNTER — Ambulatory Visit
Admission: RE | Admit: 2016-12-16 | Discharge: 2016-12-16 | Disposition: A | Discharge status: Home / Self Care | Payer: Medicaid Other | Source: Ambulatory Visit | Attending: Orthopaedic Surgery | Admitting: Orthopaedic Surgery

## 2016-12-16 DIAGNOSIS — M542 Cervicalgia: Secondary | ICD-10-CM

## 2016-12-25 ENCOUNTER — Ambulatory Visit (INDEPENDENT_AMBULATORY_CARE_PROVIDER_SITE_OTHER): Payer: Medicaid Other | Admitting: Orthopaedic Surgery

## 2016-12-25 VITALS — BP 173/100 | HR 84 | Temp 97.9°F | Ht 76.0 in | Wt 318.0 lb

## 2016-12-25 DIAGNOSIS — G894 Chronic pain syndrome: Secondary | ICD-10-CM | POA: Diagnosis not present

## 2016-12-25 DIAGNOSIS — M542 Cervicalgia: Secondary | ICD-10-CM

## 2016-12-25 DIAGNOSIS — F17210 Nicotine dependence, cigarettes, uncomplicated: Secondary | ICD-10-CM

## 2016-12-25 MED ORDER — NAPROXEN 500 MG PO TABS: 500.0000 mg | ORAL_TABLET | Freq: Two times a day (BID) | ORAL | 5 refills | Status: AC
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
## 2016-12-25 NOTE — Patient Instructions (Signed)
Steps to Quit Smoking Smoking tobacco can be bad for your health. It can also affect almost every organ in your body. Smoking puts you and people around you at risk for many serious long-lasting (chronic) diseases. Quitting smoking is hard, but it is one of the best things that you can do for your health. It is never too late to quit. What are the benefits of quitting smoking? When you quit smoking, you lower your risk for getting serious diseases and conditions. They can include:  Lung cancer or lung disease.  Heart disease.  Stroke.  Heart attack.  Not being able to have children (infertility).  Weak bones (osteoporosis) and broken bones (fractures). If you have coughing, wheezing, and shortness of breath, those symptoms may get better when you quit. You may also get sick less often. If you are pregnant, quitting smoking can help to lower your chances of having a baby of low birth weight. What can I do to help me quit smoking? Talk with your doctor about what can help you quit smoking. Some things you can do (strategies) include:  Quitting smoking totally, instead of slowly cutting back how much you smoke over a period of time.  Going to in-person counseling. You are more likely to quit if you go to many counseling sessions.  Using resources and support systems, such as:  Online chats with a counselor.  Phone quitlines.  Printed self-help materials.  Support groups or group counseling.  Text messaging programs.  Mobile phone apps or applications.  Taking medicines. Some of these medicines may have nicotine in them. If you are pregnant or breastfeeding, do not take any medicines to quit smoking unless your doctor says it is okay. Talk with your doctor about counseling or other things that can help you. Talk with your doctor about using more than one strategy at the same time, such as taking medicines while you are also going to in-person counseling. This can help make quitting  easier. What things can I do to make it easier to quit? Quitting smoking might feel very hard at first, but there is a lot that you can do to make it easier. Take these steps:  Talk to your family and friends. Ask them to support and encourage you.  Call phone quitlines, reach out to support groups, or work with a counselor.  Ask people who smoke to not smoke around you.  Avoid places that make you want (trigger) to smoke, such as:  Bars.  Parties.  Smoke-break areas at work.  Spend time with people who do not smoke.  Lower the stress in your life. Stress can make you want to smoke. Try these things to help your stress:  Getting regular exercise.  Deep-breathing exercises.  Yoga.  Meditating.  Doing a body scan. To do this, close your eyes, focus on one area of your body at a time from head to toe, and notice which parts of your body are tense. Try to relax the muscles in those areas.  Download or buy apps on your mobile phone or tablet that can help you stick to your quit plan. There are many free apps, such as QuitGuide from the CDC (Centers for Disease Control and Prevention). You can find more support from smokefree.gov and other websites. This information is not intended to replace advice given to you by your health care provider. Make sure you discuss any questions you have with your health care provider. Document Released: 07/21/2009 Document Revised: 05/22/2016 Document   Reviewed: 02/08/2015 Elsevier Interactive Patient Education  2017 [employer]vier Inc.  

## 2017-01-22 ENCOUNTER — Ambulatory Visit: Payer: Medicaid Other | Admitting: Orthopaedic Surgery

## 2017-01-31 ENCOUNTER — Encounter: Payer: Self-pay | Admitting: Orthopaedic Surgery

## 2017-01-31 ENCOUNTER — Ambulatory Visit (INDEPENDENT_AMBULATORY_CARE_PROVIDER_SITE_OTHER): Payer: Medicaid Other | Admitting: Orthopaedic Surgery

## 2017-01-31 VITALS — BP 137/86 | HR 83 | Temp 97.9°F | Ht 76.0 in | Wt 318.0 lb

## 2017-01-31 DIAGNOSIS — G894 Chronic pain syndrome: Secondary | ICD-10-CM

## 2017-01-31 DIAGNOSIS — F17210 Nicotine dependence, cigarettes, uncomplicated: Secondary | ICD-10-CM

## 2017-01-31 DIAGNOSIS — M542 Cervicalgia: Secondary | ICD-10-CM | POA: Diagnosis not present

## 2017-01-31 NOTE — Patient Instructions (Signed)
Steps to Quit Smoking Smoking tobacco can be bad for your health. It can also affect almost every organ in your body. Smoking puts you and people around you at risk for many serious long-lasting (chronic) diseases. Quitting smoking is hard, but it is one of the best things that you can do for your health. It is never too late to quit. What are the benefits of quitting smoking? When you quit smoking, you lower your risk for getting serious diseases and conditions. They can include:  Lung cancer or lung disease.  Heart disease.  Stroke.  Heart attack.  Not being able to have children (infertility).  Weak bones (osteoporosis) and broken bones (fractures). If you have coughing, wheezing, and shortness of breath, those symptoms may get better when you quit. You may also get sick less often. If you are pregnant, quitting smoking can help to lower your chances of having a baby of low birth weight. What can I do to help me quit smoking? Talk with your doctor about what can help you quit smoking. Some things you can do (strategies) include:  Quitting smoking totally, instead of slowly cutting back how much you smoke over a period of time.  Going to in-person counseling. You are more likely to quit if you go to many counseling sessions.  Using resources and support systems, such as:  Online chats with a counselor.  Phone quitlines.  Printed self-help materials.  Support groups or group counseling.  Text messaging programs.  Mobile phone apps or applications.  Taking medicines. Some of these medicines may have nicotine in them. If you are pregnant or breastfeeding, do not take any medicines to quit smoking unless your doctor says it is okay. Talk with your doctor about counseling or other things that can help you. Talk with your doctor about using more than one strategy at the same time, such as taking medicines while you are also going to in-person counseling. This can help make quitting  easier. What things can I do to make it easier to quit? Quitting smoking might feel very hard at first, but there is a lot that you can do to make it easier. Take these steps:  Talk to your family and friends. Ask them to support and encourage you.  Call phone quitlines, reach out to support groups, or work with a counselor.  Ask people who smoke to not smoke around you.  Avoid places that make you want (trigger) to smoke, such as:  Bars.  Parties.  Smoke-break areas at work.  Spend time with people who do not smoke.  Lower the stress in your life. Stress can make you want to smoke. Try these things to help your stress:  Getting regular exercise.  Deep-breathing exercises.  Yoga.  Meditating.  Doing a body scan. To do this, close your eyes, focus on one area of your body at a time from head to toe, and notice which parts of your body are tense. Try to relax the muscles in those areas.  Download or buy apps on your mobile phone or tablet that can help you stick to your quit plan. There are many free apps, such as QuitGuide from the CDC (Centers for Disease Control and Prevention). You can find more support from smokefree.gov and other websites. This information is not intended to replace advice given to you by your health care provider. Make sure you discuss any questions you have with your health care provider. Document Released: 07/21/2009 Document Revised: 05/22/2016 Document   Reviewed: 02/08/2015 Elsevier Interactive Patient Education  2017 [employer]vier Inc.  
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
## 2017-02-18 ENCOUNTER — Telehealth: Payer: Self-pay | Admitting: Orthopaedic Surgery

## 2017-02-18 NOTE — Telephone Encounter (Signed)
Call received from patient's Gwinda PasseMichelle Edwards, NP requesting to speak with Dr Hilda LiasKeeling; states she has received Dr Sanjuan DameKeeling's most recent office note; states patient is present in their office.  Relayed Dr Hilda LiasKeeling is not in the office but that I would relay the message so that he may return the call, regarding patient's pain medication.  Please call Phone# [phone] and Select option#1, and please ask to be connected to the nurse practitioner.

## 2017-02-19 NOTE — Telephone Encounter (Signed)
I called.  She is not in today, Tuesday, May 15th.  She is to call me tomorrow.

## 2017-02-20 NOTE — Telephone Encounter (Signed)
Noted  

## 2017-02-28 ENCOUNTER — Ambulatory Visit: Payer: Medicaid Other | Admitting: Orthopaedic Surgery

## 2018-10-20 ENCOUNTER — Other Ambulatory Visit: Payer: Self-pay

## 2018-10-20 ENCOUNTER — Encounter (HOSPITAL_COMMUNITY): Payer: Self-pay | Admitting: Emergency Medicine

## 2018-10-20 DIAGNOSIS — Z5321 Procedure and treatment not carried out due to patient leaving prior to being seen by health care provider: Secondary | ICD-10-CM | POA: Insufficient documentation

## 2018-10-20 DIAGNOSIS — K611 Rectal abscess: Secondary | ICD-10-CM | POA: Diagnosis not present

## 2018-10-20 NOTE — ED Triage Notes (Signed)
Pt reports abscess to rectum, denies drainage.

## 2018-10-21 ENCOUNTER — Emergency Department (HOSPITAL_COMMUNITY)
Admission: EM | Admit: 2018-10-21 | Discharge: 2018-10-21 | Disposition: A | Discharge status: Home / Self Care | Payer: Medicaid Other | Attending: Emergency Medicine | Admitting: Emergency Medicine

## 2018-10-21 HISTORY — DX: Anxiety disorder, unspecified: F41.9

## 2018-10-21 HISTORY — DX: Bipolar disorder, unspecified: F31.9

## 2020-09-03 ENCOUNTER — Encounter (HOSPITAL_COMMUNITY): Payer: Self-pay | Admitting: *Deleted

## 2020-09-03 ENCOUNTER — Emergency Department (HOSPITAL_COMMUNITY)
Admission: EM | Admit: 2020-09-03 | Discharge: 2020-09-03 | Disposition: A | Discharge status: Home / Self Care | Payer: Medicaid Other | Attending: Emergency Medicine | Admitting: Emergency Medicine

## 2020-09-03 ENCOUNTER — Other Ambulatory Visit: Payer: Self-pay

## 2020-09-03 ENCOUNTER — Emergency Department (HOSPITAL_COMMUNITY): Payer: Medicaid Other

## 2020-09-03 DIAGNOSIS — E119 Type 2 diabetes mellitus without complications: Secondary | ICD-10-CM | POA: Diagnosis not present

## 2020-09-03 DIAGNOSIS — Z7984 Long term (current) use of oral hypoglycemic drugs: Secondary | ICD-10-CM | POA: Insufficient documentation

## 2020-09-03 DIAGNOSIS — W1789XA Other fall from one level to another, initial encounter: Secondary | ICD-10-CM | POA: Diagnosis not present

## 2020-09-03 DIAGNOSIS — I10 Essential (primary) hypertension: Secondary | ICD-10-CM | POA: Diagnosis not present

## 2020-09-03 DIAGNOSIS — R0789 Other chest pain: Secondary | ICD-10-CM | POA: Diagnosis not present

## 2020-09-03 DIAGNOSIS — Z79899 Other long term (current) drug therapy: Secondary | ICD-10-CM | POA: Insufficient documentation

## 2020-09-03 DIAGNOSIS — Y9289 Other specified places as the place of occurrence of the external cause: Secondary | ICD-10-CM | POA: Insufficient documentation

## 2020-09-03 DIAGNOSIS — F17210 Nicotine dependence, cigarettes, uncomplicated: Secondary | ICD-10-CM | POA: Insufficient documentation

## 2020-09-03 MED ORDER — LISINOPRIL-HYDROCHLOROTHIAZIDE 20-12.5 MG PO TABS: 1.0000 | ORAL_TABLET | Freq: Every day | ORAL | 0 refills | Status: AC

## 2020-09-03 MED ORDER — LISINOPRIL-HYDROCHLOROTHIAZIDE 20-12.5 MG PO TABS: 1.0000 | ORAL_TABLET | Freq: Every day | ORAL | 0 refills | Status: DC

## 2020-09-03 MED ORDER — METHOCARBAMOL 500 MG PO TABS: 500.0000 mg | ORAL_TABLET | Freq: Two times a day (BID) | ORAL | 0 refills | Status: AC

## 2020-09-03 MED ORDER — METHOCARBAMOL 500 MG PO TABS: 500.0000 mg | ORAL_TABLET | Freq: Two times a day (BID) | ORAL | 0 refills | Status: DC

## 2020-09-03 NOTE — ED Notes (Signed)
Pt reports trying to remove a satellite dish from his porch and fell off of porch onto the metal pole   Now with rib pain

## 2020-09-03 NOTE — ED Triage Notes (Signed)
Pt fell off the porch and right rib hit the end of the satellite metal post.  tylenol at home without relief.
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
## 2020-09-03 NOTE — ED Notes (Signed)
Discharge delayed   resp called for incentive spir education

## 2020-09-03 NOTE — Discharge Instructions (Addendum)
You came to the emergency department today to have your right rib and flank pain assessed.  Your x-ray showed no fractures or injury to the lung.  I have prescribed a muscle relaxer to help with your rib pain.  Please take Tylenol (acetaminophen) to relieve your pain.  You may take tylenol, up to 1,000 mg (two extra strength pills).  Do not take more than 3,000 mg tylenol in a 24 hour period.  Please check all medication labels as many medications such as pain and cold medications may contain tylenol. Please do not drink alcohol while taking this medication.    Please read all medication information that you get with your prescriptions to learn about the risks and benefits of your prescriptions, and possible side effects.   While in emergency department your blood pressure was found to be elevated, with your history of high blood pressure and not taking any medications for the past 3 months I have prescribed a blood pressure medication for you to take.  Please follow-up with your primary care doctor for further blood pressure management.    Please follow-up with your primary care provider if your symptoms persist.    Please return to the emergency department if: Have difficulty breathing or shortness of breath. Develop a continual cough or you cough up thick or bloody sputum. Feel nauseous or you vomit. Have pain in your abdomen.

## 2022-09-26 ENCOUNTER — Other Ambulatory Visit: Payer: Self-pay

## 2022-09-26 ENCOUNTER — Emergency Department (HOSPITAL_COMMUNITY)
Admission: EM | Admit: 2022-09-26 | Discharge: 2022-09-26 | Disposition: A | Discharge status: Home / Self Care | Payer: Medicaid Other | Attending: Emergency Medicine | Admitting: Emergency Medicine

## 2022-09-26 DIAGNOSIS — K0889 Other specified disorders of teeth and supporting structures: Secondary | ICD-10-CM | POA: Diagnosis present

## 2022-09-26 DIAGNOSIS — E119 Type 2 diabetes mellitus without complications: Secondary | ICD-10-CM | POA: Diagnosis not present

## 2022-09-26 DIAGNOSIS — K047 Periapical abscess without sinus: Secondary | ICD-10-CM | POA: Diagnosis not present

## 2022-09-26 MED ORDER — HYDROCODONE-ACETAMINOPHEN 5-325 MG PO TABS: 1.0000 | ORAL_TABLET | Freq: Four times a day (QID) | ORAL | 0 refills | Status: DC | PRN

## 2022-09-26 MED ORDER — CLINDAMYCIN HCL 150 MG PO CAPS
300.0000 mg | ORAL_CAPSULE | Freq: Once | ORAL | Status: AC
  Administered 2022-09-26: 300 mg via ORAL
  Filled 2022-09-26: qty 2

## 2022-09-26 MED ORDER — CLINDAMYCIN HCL 300 MG PO CAPS: 300.0000 mg | ORAL_CAPSULE | Freq: Four times a day (QID) | ORAL | 0 refills | Status: AC

## 2022-09-26 MED ORDER — HYDROCODONE-ACETAMINOPHEN 5-325 MG PO TABS
2.0000 | ORAL_TABLET | Freq: Once | ORAL | Status: AC
  Administered 2022-09-26: 2 via ORAL
  Filled 2022-09-26: qty 2
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
## 2022-09-26 NOTE — ED Provider Notes (Signed)
[facility] EMERGENCY DEPARTMENT Provider Note   CSN: [id number] Arrival date & time: 09/26/22  1443     History  Chief Complaint  Patient presents with   Dental Pain    Miguel Weber is a 41 y.o. male.  Patient is a 41 year old male with history of type 2 diabetes, prior MRSA infection.  Patient presenting today with complaints of dental pain.  He reports that helping a friend install a gas line when a piece of pipe fell and struck him in the face.  He reports being struck in the upper lip and cheek and having his front teeth chipped.  This occurred several days ago, but is now having increased pain and swelling to these teeth.  He denies any loss of consciousness or any difficulty breathing or swallowing.  The history is provided by the patient.       Home Medications Prior to Admission medications   Medication Sig Start Date End Date Taking? Authorizing Provider  diazepam (VALIUM) 5 MG tablet Take 1 tablet (5 mg total) by mouth every 6 (six) hours as needed for anxiety. Take one tablet by mouth every six hours as needed for neck spasm 11/20/16   Darreld Mclean, MD  glipiZIDE (GLUCOTROL) 5 MG tablet Take 5 mg by mouth daily before breakfast.    [provider]  HYDROcodone-acetaminophen (NORCO/VICODIN) 5-325 MG tablet Take 2 tablets by mouth every 4 (four) hours as needed. 09/17/16   Elson Areas, PA-C  lisinopril-hydrochlorothiazide (ZESTORETIC) 20-12.5 MG tablet Take 1 tablet by mouth daily. 09/03/20   Haskel Schroeder, PA-C  metFORMIN (GLUCOPHAGE) 500 MG tablet Take 1,000 mg by mouth 2 (two) times daily with a meal.     [provider]  methocarbamol (ROBAXIN) 500 MG tablet Take 1 tablet (500 mg total) by mouth 2 (two) times daily. 09/03/20   Haskel Schroeder, PA-C  naproxen (NAPROSYN) 500 MG tablet Take 1 tablet (500 mg total) by mouth 2 (two) times daily with a meal. 12/25/16   Darreld Mclean, MD  ondansetron (ZOFRAN ODT) 4 MG disintegrating tablet  4mg  ODT q4 hours prn nausea/vomit 06/26/16   06/28/16, MD  oxyCODONE-acetaminophen (PERCOCET) 7.5-325 MG tablet Take 1 tablet by mouth every 4 (four) hours as needed for severe pain. 10/12/16   12/10/16, PA-C      Allergies    Tramadol, Aspirin, and Motrin [ibuprofen]    Review of Systems   Review of Systems  All other systems reviewed and are negative.   Physical Exam Updated Vital Signs BP (!) 154/93   Pulse 67   Temp 98.4 F (36.9 C) (Oral)   Resp 18   Ht 6\' 3"  (1.905 m)   Wt 129.3 kg   SpO2 99%   BMI 35.62 kg/m  Physical Exam Vitals and nursing note reviewed.  Constitutional:      General: He is not in acute distress.    Appearance: Normal appearance. He is not ill-appearing.  HENT:     Head: Normocephalic and atraumatic.     Comments: His front 2 teeth have missing pieces of enamel.  There is surrounding gingival inflammation but no obvious abscess.  He also has poor dentition throughout with multiple caries. Pulmonary:     Effort: Pulmonary effort is normal.  Skin:    General: Skin is warm and dry.  Neurological:     Mental Status: He is alert and oriented to person, place, and time.     ED Results / Procedures /  Treatments   Labs (all labs ordered are listed, but only abnormal results are displayed) Labs Reviewed - No data to display  EKG None  Radiology No results found.  Procedures Procedures    Medications Ordered in ED Medications  clindamycin (CLEOCIN) capsule 300 mg (has no administration in time range)  HYDROcodone-acetaminophen (NORCO/VICODIN) 5-325 MG per tablet 2 tablet (has no administration in time range)    ED Course/ Medical Decision Making/ A&P  Patient presenting with complaints of dental pain several days after a pipe struck him in the face.  His front 2 teeth are chipped and he is having pain in the tissues surrounding these teeth.  This came on 2 days after the initial injury.  I see no significant swelling on exam and  do not feel as though imaging studies are indicated.  Patient appears to be starting with a dental infection and this will be treated with clindamycin and pain medication.  He is to follow-up with dentistry.  Final Clinical Impression(s) / ED Diagnoses Final diagnoses:  None    Rx / DC Orders ED Discharge Orders     None         Geoffery Lyons, MD 09/26/22 [phone]

## 2022-09-26 NOTE — Discharge Instructions (Signed)
Begin taking clindamycin as prescribed.  Begin taking hydrocodone as prescribed as needed for pain.  Follow-up with dentistry in the next few days. 

## 2023-01-03 DIAGNOSIS — M25552 Pain in left hip: Secondary | ICD-10-CM | POA: Diagnosis not present

## 2023-01-03 DIAGNOSIS — Y92008 Other place in unspecified non-institutional (private) residence as the place of occurrence of the external cause: Secondary | ICD-10-CM | POA: Diagnosis not present

## 2023-01-03 DIAGNOSIS — S79912A Unspecified injury of left hip, initial encounter: Secondary | ICD-10-CM | POA: Diagnosis not present

## 2023-01-03 DIAGNOSIS — Y9389 Activity, other specified: Secondary | ICD-10-CM | POA: Diagnosis not present

## 2023-01-03 DIAGNOSIS — I10 Essential (primary) hypertension: Secondary | ICD-10-CM | POA: Diagnosis not present

## 2023-01-03 DIAGNOSIS — X501XXA Overexertion from prolonged static or awkward postures, initial encounter: Secondary | ICD-10-CM | POA: Diagnosis not present

## 2023-01-03 DIAGNOSIS — E119 Type 2 diabetes mellitus without complications: Secondary | ICD-10-CM | POA: Diagnosis not present

## 2023-01-03 DIAGNOSIS — Y999 Unspecified external cause status: Secondary | ICD-10-CM | POA: Diagnosis not present

## 2023-01-03 DIAGNOSIS — S73102A Unspecified sprain of left hip, initial encounter: Secondary | ICD-10-CM | POA: Diagnosis not present

## 2023-01-03 DIAGNOSIS — Z720 Tobacco use: Secondary | ICD-10-CM | POA: Diagnosis not present

## 2023-01-21 DIAGNOSIS — E559 Vitamin D deficiency, unspecified: Secondary | ICD-10-CM | POA: Diagnosis not present

## 2023-01-21 DIAGNOSIS — E119 Type 2 diabetes mellitus without complications: Secondary | ICD-10-CM | POA: Diagnosis not present

## 2023-01-21 DIAGNOSIS — R892 Abnormal level of other drugs, medicaments and biological substances in specimens from other organs, systems and tissues: Secondary | ICD-10-CM | POA: Diagnosis not present

## 2023-01-21 DIAGNOSIS — Z79899 Other long term (current) drug therapy: Secondary | ICD-10-CM | POA: Diagnosis not present

## 2023-01-21 DIAGNOSIS — E785 Hyperlipidemia, unspecified: Secondary | ICD-10-CM | POA: Diagnosis not present

## 2023-02-01 DIAGNOSIS — E785 Hyperlipidemia, unspecified: Secondary | ICD-10-CM | POA: Diagnosis not present

## 2023-02-01 DIAGNOSIS — I10 Essential (primary) hypertension: Secondary | ICD-10-CM | POA: Diagnosis not present

## 2023-02-01 DIAGNOSIS — Z79899 Other long term (current) drug therapy: Secondary | ICD-10-CM | POA: Diagnosis not present

## 2023-02-01 DIAGNOSIS — E119 Type 2 diabetes mellitus without complications: Secondary | ICD-10-CM | POA: Diagnosis not present

## 2023-02-01 DIAGNOSIS — R892 Abnormal level of other drugs, medicaments and biological substances in specimens from other organs, systems and tissues: Secondary | ICD-10-CM | POA: Diagnosis not present

## 2023-02-06 DIAGNOSIS — I10 Essential (primary) hypertension: Secondary | ICD-10-CM | POA: Diagnosis not present

## 2023-02-06 DIAGNOSIS — R030 Elevated blood-pressure reading, without diagnosis of hypertension: Secondary | ICD-10-CM | POA: Diagnosis not present

## 2023-02-06 DIAGNOSIS — Z79899 Other long term (current) drug therapy: Secondary | ICD-10-CM | POA: Diagnosis not present

## 2023-02-06 DIAGNOSIS — E785 Hyperlipidemia, unspecified: Secondary | ICD-10-CM | POA: Diagnosis not present

## 2023-02-06 DIAGNOSIS — E119 Type 2 diabetes mellitus without complications: Secondary | ICD-10-CM | POA: Diagnosis not present

## 2024-03-31 ENCOUNTER — Telehealth (INDEPENDENT_AMBULATORY_CARE_PROVIDER_SITE_OTHER): Payer: Self-pay | Admitting: Primary Care

## 2024-03-31 NOTE — Telephone Encounter (Signed)
 Called pt to confirm appt. Pt's phone is unavailable.

## 2024-04-07 ENCOUNTER — Encounter (INDEPENDENT_AMBULATORY_CARE_PROVIDER_SITE_OTHER): Payer: Self-pay | Admitting: Primary Care

## 2024-04-07 ENCOUNTER — Encounter (INDEPENDENT_AMBULATORY_CARE_PROVIDER_SITE_OTHER): Payer: Self-pay

## 2024-04-13 NOTE — Progress Notes (Signed)
 Patient was not made aware we did not accept [facility]

## 2024-05-27 ENCOUNTER — Emergency Department (HOSPITAL_COMMUNITY)
Admission: EM | Admit: 2024-05-27 | Discharge: 2024-05-27 | Disposition: A | Discharge status: Home / Self Care | Payer: Self-pay | Attending: Emergency Medicine | Admitting: Emergency Medicine

## 2024-05-27 ENCOUNTER — Other Ambulatory Visit: Payer: Self-pay

## 2024-05-27 ENCOUNTER — Emergency Department (HOSPITAL_COMMUNITY): Payer: Self-pay

## 2024-05-27 ENCOUNTER — Encounter (HOSPITAL_COMMUNITY): Payer: Self-pay

## 2024-05-27 DIAGNOSIS — E119 Type 2 diabetes mellitus without complications: Secondary | ICD-10-CM | POA: Diagnosis not present

## 2024-05-27 DIAGNOSIS — M545 Low back pain, unspecified: Secondary | ICD-10-CM | POA: Insufficient documentation

## 2024-05-27 DIAGNOSIS — I10 Essential (primary) hypertension: Secondary | ICD-10-CM | POA: Insufficient documentation

## 2024-05-27 DIAGNOSIS — Z79899 Other long term (current) drug therapy: Secondary | ICD-10-CM | POA: Diagnosis not present

## 2024-05-27 DIAGNOSIS — Z7984 Long term (current) use of oral hypoglycemic drugs: Secondary | ICD-10-CM | POA: Diagnosis not present

## 2024-05-27 MED ORDER — LIDOCAINE 5 % EX PTCH: 1.0000 | MEDICATED_PATCH | CUTANEOUS | 0 refills | Status: AC

## 2024-05-27 MED ORDER — OXYCODONE-ACETAMINOPHEN 5-325 MG PO TABS
1.0000 | ORAL_TABLET | Freq: Once | ORAL | Status: AC
  Administered 2024-05-27: 1 via ORAL
  Filled 2024-05-27: qty 1

## 2024-05-27 MED ORDER — OXYCODONE HCL 5 MG PO TABS: 5.0000 mg | ORAL_TABLET | ORAL | 0 refills | Status: AC | PRN

## 2024-05-27 NOTE — Discharge Instructions (Addendum)
 It was a pleasure taking care of you today.  Based on your history, physical exam, and imaging I feel you are safe for discharge.  The imaging today of your low back was negative for acute fracture or abnormality.  As a result you have been prescribed outpatient symptomatic medications.  Please take over-the-counter Tylenol  and use rest and ice to help with symptoms.  You have been prescribed lidocaine  patches as well as narcotic pain medication. Please take as prescribed and please only use the narcotic pain medication for breakthrough pain. You have also been given an orthopedic referral, please call as soon as possible for a follow-up appointment. Please keep in mind that the max daily dose of tylenol  is 4000mg /day.  Experiencing the following symptoms include not limited to severe pain, radiating numbness/tingling down to lower extremities, unexplained weakness, inability to walk, bowel/bladder incontinence or retention, inability to walk, or other concerning symptom please return the emergency department or seek further medical care.

## 2024-05-27 NOTE — ED Provider Notes (Signed)
 [place] EMERGENCY DEPARTMENT AT [facility] Provider Note   CSN: [id number] Arrival date & time: 05/27/24  1341     Patient presents with: Back Pain   Miguel Weber is a 43 y.o. male who presents to the emergency department with a chief complaint of acute low back pain.  Patient states that he was lifting up a trailer yesterday to place on a trailer hitch and the trailer fell off the other side of the hitch and when it did twisted his lower back.  Patient states that ever since then he has had persistent severe pain.  Denies radiation of this pain anywhere else other than his low back.  Denies weakness in his lower extremities.  Denies groin numbness, urinary/bowel retention or incontinence.  Patient is ambulatory but states that he is not quite able to stand up completely straight due to pain.  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  Past medical history significant for hypertension, diabetes, bipolar 1, anxiety, etc.    Back Pain      Prior to Admission medications   Medication Sig Start Date End Date Taking? Authorizing Provider  lidocaine  (LIDODERM ) 5 % Place 1 patch onto the skin daily. Remove & Discard patch within 12 hours or as directed by MD 05/27/24  Yes Hendel Gatliff F, PA-C  oxyCODONE  (ROXICODONE ) 5 MG immediate release tablet Take 1 tablet (5 mg total) by mouth every 4 (four) hours as needed for severe pain (pain score 7-10) or breakthrough pain. 05/27/24  Yes David Towson F, PA-C  amLODipine (NORVASC) 10 MG tablet Take 10 mg by mouth daily. 03/26/24   [provider]  amoxicillin  (AMOXIL ) 500 MG capsule Take 1,000 mg by mouth 2 (two) times daily. 03/21/24   [provider]  Cholecalciferol (VITAMIN D3) 1.25 MG (50000 UT) CAPS Take 1 capsule by mouth once a week. 03/18/24   [provider]  clindamycin  (CLEOCIN )  300 MG capsule Take 1 capsule (300 mg total) by mouth 4 (four) times daily. X 7 days Patient not taking: Reported on 04/07/2024 09/26/22   Geroldine Berg, MD  diazepam  (VALIUM ) 5 MG tablet Take 1 tablet (5 mg total) by mouth every 6 (six) hours as needed for anxiety. Take one tablet by mouth every six hours as needed for neck spasm Patient not taking: Reported on 04/07/2024 11/20/16   Brenna Lin, MD  FARXIGA 10 MG TABS tablet Take 10 mg by mouth daily. 03/19/24   [provider]  glimepiride (AMARYL) 4 MG tablet Take 4 mg by mouth daily. 03/18/24   [provider]  glipiZIDE (GLUCOTROL) 5 MG tablet Take 5 mg by mouth daily before breakfast.    [provider]  lisinopril -hydrochlorothiazide  (ZESTORETIC ) 20-12.5 MG tablet Take 1 tablet by mouth daily. Patient not taking: Reported on 04/07/2024 09/03/20   Eudelia Maude SAUNDERS, PA-C  losartan (COZAAR) 50 MG tablet Take 50 mg by mouth daily. 03/26/24   [provider]  metFORMIN (GLUCOPHAGE) 500 MG tablet Take 1,000 mg by mouth 2 (two) times daily with a meal.     [provider]  methocarbamol  (ROBAXIN ) 500 MG tablet Take 1 tablet (500 mg total) by mouth 2 (two) times daily. Patient not taking: Reported on 04/07/2024 09/03/20   Eudelia Maude SAUNDERS, PA-C  naproxen  (NAPROSYN ) 500 MG tablet Take 1 tablet (500 mg total) by mouth 2 (two)  times daily with a meal. Patient not taking: Reported on 04/07/2024 12/25/16   Brenna Lin, MD  ondansetron  (ZOFRAN  ODT) 4 MG disintegrating tablet 4mg  ODT q4 hours prn nausea/vomit Patient not taking: Reported on 04/07/2024 06/26/16   Tonia Chew, MD    Allergies: Tramadol , Aspirin, and Motrin [ibuprofen]    Review of Systems  Musculoskeletal:  Positive for back pain.    Updated Vital Signs BP (!) 147/96 (BP Location: Right Arm)   Pulse 81   Temp (!) 97.5 F (36.4 C) (Oral)   Resp 19   Ht 6' 4 (1.93 m)   Wt 136.1 kg   SpO2 96%   BMI 36.52 kg/m   Physical  Exam Vitals and nursing note reviewed.  Constitutional:      General: He is awake.     Appearance: Normal appearance.  HENT:     Head: Normocephalic and atraumatic.  Eyes:     General: No scleral icterus. Pulmonary:     Effort: Pulmonary effort is normal. No respiratory distress.  Musculoskeletal:        General: Normal range of motion.     Cervical back: Normal range of motion.     Right lower leg: No edema.     Left lower leg: No edema.     Comments: No cervical or thoracic spine tenderness, mild generalized pain with palpation of lumbar spine area including paraspinal muscles  Equal strength of bilateral upper extremities with finger squeeze test, patient able to raise left and right lower extremities off the bed against resistance, patient able to extend bilateral knees against resistance, patient able to flex bilateral knees against resistance, patient ambulatory under own power without assistance, bilateral lower extremities neurovascularly intact   Skin:    General: Skin is warm.     Capillary Refill: Capillary refill takes less than 2 seconds.  Neurological:     General: No focal deficit present.     Mental Status: He is alert and oriented to person, place, and time.  Psychiatric:        Mood and Affect: Mood normal.        Behavior: Behavior normal. Behavior is cooperative.     (all labs ordered are listed, but only abnormal results are displayed) Labs Reviewed - No data to display  EKG: None  Radiology: DG Lumbar Spine Complete Result Date: 05/27/2024 CLINICAL DATA:  Acute lower back pain due to strenuous activity. EXAM: LUMBAR SPINE - COMPLETE 4+ VIEW COMPARISON:  June 26, 2016. FINDINGS: There is no evidence of lumbar spine fracture. Alignment is normal. Intervertebral disc spaces are maintained. IMPRESSION: Negative. Electronically Signed   By: Lynwood Landy Raddle M.D.   On: 05/27/2024 17:34     Procedures   Medications Ordered in the ED   oxyCODONE -acetaminophen  (PERCOCET/ROXICET) 5-325 MG per tablet 1 tablet (1 tablet Oral Given 05/27/24 1830)                                    Medical Decision Making Risk Prescription drug management.   Patient presents to the ED for concern of acute low back pain, this involves an extensive number of treatment options, and is a complaint that carries with it a high risk of complications and morbidity.  The differential diagnosis includes fracture, bulging disc, spinal stenosis, nerve impingement, abscess, cauda equina syndrome, etc.   Co morbidities that complicate the patient evaluation   hypertension, diabetes,  bipolar 1, anxiety   Imaging Studies ordered:  I ordered imaging studies including x-ray of lumbar spine I independently visualized and interpreted imaging which showed no acute fracture I agree with the radiologist interpretation   Medicines ordered and prescription drug management:  I ordered medication including Percocet for pain Reevaluation of the patient after these medicines showed that the patient improved I have reviewed the patients home medicines and have made adjustments as needed   Test Considered:  CT versus MRI: Declined at this time as patient is neurologically intact in bed, no lower extremity deficits on my exam, equal strength, patient denies pain radiation or numbness/tingling, denies weakness, no urinary/bowel symptoms, no groin numbness or tingling, patient ambulatory, low clinical suspicion for cauda equina syndrome or acute life-threatening cause of low back pain, I do not believe a CT or MRI would change my management in the emergency department   Critical Interventions:  None   Problem List / ED Course:  43 year old male, acute low back pain, vital signs stable On physical exam tenderness with palpation of lumbar spine as well as paraspinal muscles Patient neurologically intact, ambulatory, no lower extremity weakness, good strength  bilaterally, no groin numbness or tingling, no urinary or bowel symptoms Low clinical suspicion for cauda equina syndrome, fracture, suspect musculoskeletal etiology Plain films today negative they were ordered in triage, plan for symptomatic treatment and orthopedic referral Patient states that he is unable to take anti-inflammatory medication as he has reactions to it where it makes him sick, will instead plan for Tylenol , lidocaine  patches, muscle relaxant and short course of opioid medication for breakthrough pain, patient also declined muscle relaxants stating they do not work for him  Patient discharged with prescription for Lidoderm  patches and very short course of narcotic pain medication, patient also given orthopedic referral for follow-up Return precautions given Patient discharged Most likely diagnosis at this time is musculoskeletal back pain, low clinical suspicion for cauda equina syndrome, fracture, abscess, or other acute life-threatening process as patient had a mechanism for his back injury, ambulatory under own power, denies bowel/bladder symptoms, groin numbness, weakness, or other red flag symptoms, plain films today negative for obvious abnormality   Reevaluation:  After the interventions noted above, I reevaluated the patient and found that they have :improved   Social Determinants of Health:  No PCP   Dispostion:  After consideration of the diagnostic results and the patients response to treatment, I feel that the patent would benefit from discharge and outpatient therapies prescribed.  Follow-up with peds if symptoms persist or worsen.     Final diagnoses:  Acute bilateral low back pain without sciatica    ED Discharge Orders          Ordered    oxyCODONE  (ROXICODONE ) 5 MG immediate release tablet  Every 4 hours PRN        05/27/24 2022    lidocaine  (LIDODERM ) 5 %  Every 24 hours        05/27/24 2022               Janetta Terrall FALCON,  [place] 05/28/24 0227    Armenta Canning, MD 06/01/24 [phone]

## 2024-05-27 NOTE — ED Triage Notes (Signed)
 C/O lower back pain since yesterday due to strenuous activity. Denies numbness/tingling.
